# Patient Record
Sex: Male | Born: 1955 | Race: White | Hispanic: No | Smoking: Never smoker
Health system: Southern US, Community
[De-identification: ages and names within clinical notes are randomized; demographics above are authoritative.]

## PROBLEM LIST (undated history)

## (undated) DIAGNOSIS — C61 Malignant neoplasm of prostate: Secondary | ICD-10-CM

## (undated) DIAGNOSIS — I10 Essential (primary) hypertension: Secondary | ICD-10-CM

## (undated) DIAGNOSIS — G4733 Obstructive sleep apnea (adult) (pediatric): Secondary | ICD-10-CM

## (undated) DIAGNOSIS — G4730 Sleep apnea, unspecified: Secondary | ICD-10-CM

## (undated) DIAGNOSIS — M1990 Unspecified osteoarthritis, unspecified site: Secondary | ICD-10-CM

## (undated) DIAGNOSIS — E538 Deficiency of other specified B group vitamins: Secondary | ICD-10-CM

## (undated) DIAGNOSIS — E7800 Pure hypercholesterolemia, unspecified: Secondary | ICD-10-CM

## (undated) DIAGNOSIS — K760 Fatty (change of) liver, not elsewhere classified: Secondary | ICD-10-CM

## (undated) DIAGNOSIS — R7303 Prediabetes: Secondary | ICD-10-CM

## (undated) DIAGNOSIS — F1090 Alcohol use, unspecified, uncomplicated: Secondary | ICD-10-CM

## (undated) DIAGNOSIS — Z87820 Personal history of traumatic brain injury: Secondary | ICD-10-CM

## (undated) HISTORY — PX: PROSTATE BIOPSY: SHX241

## (undated) HISTORY — DX: Deficiency of other specified B group vitamins: E53.8

## (undated) HISTORY — DX: Obstructive sleep apnea (adult) (pediatric): G47.33

## (undated) HISTORY — DX: Alcohol use, unspecified, uncomplicated: F10.90

## (undated) HISTORY — DX: Prediabetes: R73.03

## (undated) HISTORY — DX: Personal history of traumatic brain injury: Z87.820

## (undated) HISTORY — DX: Fatty (change of) liver, not elsewhere classified: K76.0

---

## 1974-01-11 HISTORY — PX: KNEE ARTHROTOMY: SHX993

## 2007-01-12 HISTORY — PX: ACHILLES TENDON REPAIR: SUR1153

## 2009-08-22 ENCOUNTER — Encounter: Admission: RE | Admit: 2009-08-22 | Discharge: 2009-08-22 | Payer: Self-pay | Admitting: Orthopaedic Surgery

## 2009-12-11 ENCOUNTER — Encounter: Admission: RE | Admit: 2009-12-11 | Discharge: 2009-12-11 | Payer: Self-pay | Admitting: Orthopaedic Surgery

## 2010-01-11 HISTORY — PX: PROSTATECTOMY: SHX69

## 2010-08-12 ENCOUNTER — Other Ambulatory Visit: Payer: Self-pay | Admitting: Urology

## 2010-08-12 ENCOUNTER — Other Ambulatory Visit (HOSPITAL_COMMUNITY): Payer: Self-pay | Admitting: Urology

## 2010-08-12 ENCOUNTER — Encounter (HOSPITAL_COMMUNITY): Payer: BC Managed Care – PPO

## 2010-08-12 ENCOUNTER — Ambulatory Visit (HOSPITAL_COMMUNITY)
Admission: RE | Admit: 2010-08-12 | Discharge: 2010-08-12 | Disposition: A | Discharge status: Home / Self Care | Payer: BC Managed Care – PPO | Source: Ambulatory Visit | Attending: Urology | Admitting: Urology

## 2010-08-12 DIAGNOSIS — Z01812 Encounter for preprocedural laboratory examination: Secondary | ICD-10-CM | POA: Insufficient documentation

## 2010-08-12 DIAGNOSIS — Z01810 Encounter for preprocedural cardiovascular examination: Secondary | ICD-10-CM | POA: Insufficient documentation

## 2010-08-12 DIAGNOSIS — I10 Essential (primary) hypertension: Secondary | ICD-10-CM | POA: Insufficient documentation

## 2010-08-12 DIAGNOSIS — C61 Malignant neoplasm of prostate: Secondary | ICD-10-CM

## 2010-08-12 DIAGNOSIS — Z01818 Encounter for other preprocedural examination: Secondary | ICD-10-CM | POA: Insufficient documentation

## 2010-08-12 DIAGNOSIS — R9431 Abnormal electrocardiogram [ECG] [EKG]: Secondary | ICD-10-CM | POA: Insufficient documentation

## 2010-08-12 DIAGNOSIS — G4730 Sleep apnea, unspecified: Secondary | ICD-10-CM | POA: Insufficient documentation

## 2010-08-12 LAB — CBC
HCT: 41.8 % (ref 39.0–52.0)
Hemoglobin: 14.8 g/dL (ref 13.0–17.0)
MCV: 91.3 fL (ref 78.0–100.0)
RBC: 4.58 MIL/uL (ref 4.22–5.81)
RDW: 12.8 % (ref 11.5–15.5)
WBC: 4.7 10*3/uL (ref 4.0–10.5)

## 2010-08-12 LAB — BASIC METABOLIC PANEL
BUN: 15 mg/dL (ref 6–23)
CO2: 24 mEq/L (ref 19–32)
Chloride: 102 mEq/L (ref 96–112)
Creatinine, Ser: 0.8 mg/dL (ref 0.50–1.35)
GFR calc Af Amer: >60 mL/min (ref >60)
Glucose, Bld: 108 mg/dL — ABNORMAL HIGH (ref 70–99)
Potassium: 3.7 mEq/L (ref 3.5–5.1)

## 2010-08-17 ENCOUNTER — Other Ambulatory Visit: Payer: Self-pay | Admitting: Urology

## 2010-08-17 ENCOUNTER — Inpatient Hospital Stay (HOSPITAL_COMMUNITY)
Admission: RE | Admit: 2010-08-17 | Discharge: 2010-08-18 | DRG: 334 | Disposition: A | Discharge status: Home / Self Care | Payer: BC Managed Care – PPO | Source: Ambulatory Visit | Attending: Urology | Admitting: Urology

## 2010-08-17 DIAGNOSIS — G4733 Obstructive sleep apnea (adult) (pediatric): Secondary | ICD-10-CM | POA: Diagnosis present

## 2010-08-17 DIAGNOSIS — E538 Deficiency of other specified B group vitamins: Secondary | ICD-10-CM | POA: Diagnosis present

## 2010-08-17 DIAGNOSIS — I10 Essential (primary) hypertension: Secondary | ICD-10-CM | POA: Diagnosis present

## 2010-08-17 DIAGNOSIS — C61 Malignant neoplasm of prostate: Principal | ICD-10-CM | POA: Diagnosis present

## 2010-08-17 DIAGNOSIS — E559 Vitamin D deficiency, unspecified: Secondary | ICD-10-CM | POA: Diagnosis present

## 2010-08-17 DIAGNOSIS — Z01812 Encounter for preprocedural laboratory examination: Secondary | ICD-10-CM

## 2010-08-17 DIAGNOSIS — E7800 Pure hypercholesterolemia, unspecified: Secondary | ICD-10-CM | POA: Diagnosis present

## 2010-08-17 LAB — HEMOGLOBIN AND HEMATOCRIT, BLOOD: Hemoglobin: 14.2 g/dL (ref 13.0–17.0)

## 2010-08-17 LAB — TYPE AND SCREEN
ABO/RH(D): O NEG
Antibody Screen: NEGATIVE

## 2010-08-31 NOTE — Op Note (Signed)
[name], [name] NO.:  [id number]  MEDICAL RECORD NO.:  [id number]  LOCATION:  1438                         FACILITY:  [facility]  PHYSICIAN:  Heloise Purpura, MD      DATE OF BIRTH:  09-15-1955  DATE OF PROCEDURE:  08/17/2010 DATE OF DISCHARGE:                              OPERATIVE REPORT   PREOPERATIVE DIAGNOSIS:  Clinically localized adenocarcinoma of the prostate (clinical stage T1c, NX, MX).  POSTOPERATIVE DIAGNOSIS:  Clinically localized adenocarcinoma of the prostate (clinical stage T1c, NX, MX).  PROCEDURES.: 1. Robotic-assisted laparoscopic radical prostatectomy (bilateral     nerves). 2. Bilateral robotic-assisted laparoscopic pelvic lymphadenectomy.  SURGEON:  Heloise Purpura, MD  ASSISTANT:  Delia Chimes, NP  ANESTHESIA:  General.  COMPLICATIONS:  None.  ESTIMATED BLOOD LOSS:  350 mL.  INTRAVENOUS FLUIDS:  2 liters of crystalloid.  SPECIMENS: 1. Prostate seminal vesicles. 2. Right pelvic lymph nodes. 3. Left pelvic lymph nodes.  DISPOSITION:  Specimens to pathology.  DRAINS: 1. 20-French coude cath. 2. #19 Blake pelvic drain.  INDICATIONS:  [name] is a 55 year old gentleman with clinically localized adenocarcinoma of prostate.  After discussing management options for treatment, he elected to proceed with surgical therapy and the above procedures.  The potential risks, complications, and alternative treatment options were discussed in detail, and informed consent obtained.  DESCRIPTION OF PROCEDURE:  The patient was taken to the operating room and a general anesthetic was administered.  He was given preoperative antibiotics, placed in the dorsal lithotomy position, and prepped and draped in the usual sterile fashion.  Next preoperative time-out was performed.  A Foley catheter was then inserted into the bladder and a site was selected just superior to the umbilicus for placement of the camera port.  This was placed using  standard open Hasson technique which allowed entry into the peritoneal cavity under direct vision without difficulty.  A 12 mm port was placed and the pneumoperitoneum established.  With a 0 degrees lens, the abdomen was inspected and there was no evidence for any intra-abdominal injuries or other abnormalities. The remaining ports were then placed.  An 8 mm robotic ports were placed in the right lower quadrant, left lower quadrant, and far left lower quadrant.  A 5 mm port was in the right upper quadrant and a 12 mm port was placed in the far right lateral abdominal wall for laparoscopic assistance.  All ports were placed under direct vision without difficulty.  The surgical cart was then docked.  With the aid of the cautery scissors, the bladder was reflected posteriorly allowing entry into the space of Retzius and identification of the endopelvic fascia and prostate.  The endopelvic fascia was incised from the apex back to the base of the prostate bilaterally.  The underlying levator muscle fibers were swept laterally off the prostate thereby isolating the dorsal venous complex.  The dorsal vein was then stapled and divided with a 45 mm flex Echelon stapler.  The bladder neck was identified with the aid of Foley catheter manipulation and was divided anteriorly.  This exposed the catheter and the catheter balloon was deflated.  The catheter was brought into the operative field and used  to retract the prostate anteriorly.  This exposed the posterior bladder neck which did reveal a small median lobe.  This was able to be excised with the prostate and dissection proceeded between the prostate and bladder until the vasa deferentia and seminal vesicles were identified.  The vasa deferentia were isolated, divided and lifted anteriorly.  The seminal vesicles were then dissected down to their tips with care to control the seminal vesicle arterial blood supply.  The space between the  anterior rectum and Denonvilliers' fascia was then developed with a combination of sharp and blunt dissection.  The space was somewhat obscured and did require some sharp dissection prior to entering the correct plane.  The lateral prostatic fascia was then incised sharply bilaterally allowing the neurovascular bundles to be preserved.  On the right side, the vascular pedicle, the prostate was ligated with Hem-o-lok clips and divided with sharp cold scissor dissection.  On the left side, the vascular pedicle was also ligated with Hem-o-lok clips and divided with sharp cold scissor dissection.  Due to the patient's extent of disease on the left side, a few millimeters of periprosthetic fat was left on the prostatic capsule in essence performing a partial nerve sparing procedure on left side.  The neurovascular bundles were then swept off the apex of the prostate and urethra.  The urethra was sharply transected allowing the prostate specimen to be disarticulated.  The pelvis was then copiously irrigated.  Hemostasis was assured.  There was no evidence for a rectal history.  Attention then turned to the right pelvic sidewall.  The fibrofatty tissue between the external iliac vein, confluence of the iliac vessels, hypogastric artery, and Cooper's ligament was dissected free from the pelvic sidewall with care to preserve the obturator nerve.  Hem-o-lok clips used for lymphostasis and hemostasis.  An identical procedure was performed on the contralateral side and both lymphatic packets were subsequently removed for permanent pathologic analysis.  Attention turned to the urethral anastomosis. However, there was noted to be some bleeding from the left neurovascular bundle which was controlled with multiple figure-of-eight sutures of 2-0 Vicryl and 3-0 Monocryl.  This resulted in excellent hemostasis and the urethral anastomosis was then performed with a 2-0 Vicryl slip-knot used to reapproximate  the bladder neck and urethra as well as the Denonvilliers' fascia.  A double-armed 3-0 Monocryl suture was then used to perform a 360 degree running tension-free anastomosis between these structures.  A 20-French coude catheter was inserted into the bladder and irrigated.  There were no blood clots within the bladder and the anastomosis appeared to be watertight.  A #19 Blake drain was then brought through the left robotic port, positioned appropriately into the pelvis.  It was secured to skin with a nylon suture.  The surgical cart was then undocked.  The right lateral 12-mm port site was closed with a 0 Vicryl suture placed laparoscopically.  All remaining ports removed under direct vision and the prostate was removed intact within the Endopouch retrieval bag via the periumbilical port site.  This fascial opening was then closed with two running 0 Vicryl sutures.  All port sites were injected with 0.25% Marcaine and reapproximated at skin level with staples.  Sterile dressings were applied.  The patient appeared to tolerate the procedure well without complications.  He was able to be extubated and transferred to the recovery unit in satisfactory condition.     Heloise Purpura, MD     LB/MEDQ  D:  08/17/2010  T:  08/18/2010  Job:  [id number]  Electronically Signed by Heloise Purpura MD on 08/31/2010 06:45:19 PM

## 2010-11-23 ENCOUNTER — Emergency Department (HOSPITAL_COMMUNITY)
Admission: EM | Admit: 2010-11-23 | Discharge: 2010-11-24 | Discharge status: Against Medical Advice | Payer: BC Managed Care – PPO | Attending: Emergency Medicine | Admitting: Emergency Medicine

## 2010-11-23 ENCOUNTER — Encounter: Payer: Self-pay | Admitting: *Deleted

## 2010-11-23 DIAGNOSIS — R509 Fever, unspecified: Secondary | ICD-10-CM | POA: Insufficient documentation

## 2010-11-23 HISTORY — DX: Malignant neoplasm of prostate: C61

## 2010-11-23 MED ORDER — ACETAMINOPHEN 325 MG PO TABS
650.0000 mg | ORAL_TABLET | Freq: Once | ORAL | Status: AC
  Administered 2010-11-23: 650 mg via ORAL

## 2010-11-23 MED ORDER — ACETAMINOPHEN 325 MG PO TABS
ORAL_TABLET | ORAL | Status: AC
  Administered 2010-11-23: 650 mg via ORAL
  Filled 2010-11-23: qty 2

## 2010-11-23 NOTE — ED Notes (Signed)
Pt reports fever at home, highest 104.6. Chills, joint soreness. denies cough, n/v.

## 2011-08-04 ENCOUNTER — Other Ambulatory Visit (HOSPITAL_COMMUNITY): Payer: Self-pay | Admitting: Orthopaedic Surgery

## 2011-08-05 ENCOUNTER — Other Ambulatory Visit (HOSPITAL_COMMUNITY): Payer: Self-pay | Admitting: Orthopaedic Surgery

## 2011-08-06 ENCOUNTER — Encounter (HOSPITAL_COMMUNITY): Payer: Self-pay | Admitting: Pharmacy Technician

## 2011-08-10 ENCOUNTER — Ambulatory Visit (HOSPITAL_COMMUNITY)
Admission: RE | Admit: 2011-08-10 | Discharge: 2011-08-10 | Disposition: A | Discharge status: Home / Self Care | Payer: BC Managed Care – PPO | Source: Ambulatory Visit | Attending: Orthopaedic Surgery | Admitting: Orthopaedic Surgery

## 2011-08-10 ENCOUNTER — Encounter (HOSPITAL_COMMUNITY): Payer: Self-pay

## 2011-08-10 ENCOUNTER — Encounter (HOSPITAL_COMMUNITY)
Admission: RE | Admit: 2011-08-10 | Discharge: 2011-08-10 | Disposition: A | Discharge status: Home / Self Care | Payer: BC Managed Care – PPO | Source: Ambulatory Visit | Attending: Orthopaedic Surgery | Admitting: Orthopaedic Surgery

## 2011-08-10 DIAGNOSIS — Z01810 Encounter for preprocedural cardiovascular examination: Secondary | ICD-10-CM | POA: Insufficient documentation

## 2011-08-10 DIAGNOSIS — Z01812 Encounter for preprocedural laboratory examination: Secondary | ICD-10-CM | POA: Insufficient documentation

## 2011-08-10 DIAGNOSIS — Z01818 Encounter for other preprocedural examination: Secondary | ICD-10-CM | POA: Insufficient documentation

## 2011-08-10 HISTORY — DX: Pure hypercholesterolemia, unspecified: E78.00

## 2011-08-10 HISTORY — DX: Unspecified osteoarthritis, unspecified site: M19.90

## 2011-08-10 HISTORY — DX: Sleep apnea, unspecified: G47.30

## 2011-08-10 HISTORY — DX: Essential (primary) hypertension: I10

## 2011-08-10 LAB — URINALYSIS, ROUTINE W REFLEX MICROSCOPIC
Glucose, UA: NEGATIVE mg/dL
Hgb urine dipstick: NEGATIVE
Ketones, ur: NEGATIVE mg/dL
Protein, ur: NEGATIVE mg/dL
pH: 6 (ref 5.0–8.0)

## 2011-08-10 LAB — BASIC METABOLIC PANEL
BUN: 12 mg/dL (ref 6–23)
CO2: 25 mEq/L (ref 19–32)
Chloride: 100 mEq/L (ref 96–112)
Glucose, Bld: 112 mg/dL — ABNORMAL HIGH (ref 70–99)
Potassium: 4 mEq/L (ref 3.5–5.1)

## 2011-08-10 LAB — CBC
HCT: 42.5 % (ref 39.0–52.0)
Hemoglobin: 15.1 g/dL (ref 13.0–17.0)
MCH: 31.9 pg (ref 26.0–34.0)
MCHC: 35.5 g/dL (ref 30.0–36.0)
RBC: 4.73 MIL/uL (ref 4.22–5.81)

## 2011-08-10 LAB — SURGICAL PCR SCREEN
MRSA, PCR: NEGATIVE
Staphylococcus aureus: NEGATIVE

## 2011-08-10 LAB — PROTIME-INR: Prothrombin Time: 12.9 seconds (ref 11.6–15.2)

## 2011-08-10 NOTE — Patient Instructions (Signed)
20 [name]  08/10/2011   Your procedure is scheduled on:  08/20/11 AT 12:30 PM  Report to SHORT STAY DEPT  at 10:00 AM.  Call this number if you have problems the morning of surgery: [phone]   Remember:   Do not eat food  AFTER MIDNIGHT  May have clear liquids UNTIL 6 HOURS BEFORE SURGERY (6:30 AM)  Clear liquids include soda, tea, black coffee, apple or grape juice, broth.  Take these medicines the morning of surgery with A SIP OF WATER: VICODIN IF NEEDED   Do not wear jewelry, make-up or nail polish.  Do not wear lotions, powders, or perfumes.   Do not shave legs or underarms 48 hrs. before surgery (men may shave face)  Do not bring valuables to the hospital.  Contacts, dentures or bridgework may not be worn into surgery.  Leave suitcase in the car. After surgery it may be brought to your room.  For patients admitted to the hospital, checkout time is 11:00 AM the day of discharge.   Patients discharged the day of surgery will not be allowed to drive home.    Special Instructions:   Please read over the following fact sheets that you were given: MRSA  Information               SHOWER WITH BETASEPT THE NIGHT BEFORE SURGERY AND THE MORNING OF SURGERY               STOP ASPIRIN AND SUPPLEMENT 5-7 DAYS PREOP              BRING C-PAP MASK / TUBING TO HOSPITAL

## 2011-08-19 ENCOUNTER — Other Ambulatory Visit (HOSPITAL_COMMUNITY): Payer: Self-pay | Admitting: Orthopaedic Surgery

## 2011-08-20 ENCOUNTER — Ambulatory Visit (HOSPITAL_COMMUNITY): Payer: BC Managed Care – PPO

## 2011-08-20 ENCOUNTER — Encounter (HOSPITAL_COMMUNITY): Payer: Self-pay | Admitting: Anesthesiology

## 2011-08-20 ENCOUNTER — Inpatient Hospital Stay (HOSPITAL_COMMUNITY): Payer: BC Managed Care – PPO

## 2011-08-20 ENCOUNTER — Encounter (HOSPITAL_COMMUNITY)
Admission: RE | Disposition: A | Discharge status: Home Health | Payer: Self-pay | Source: Ambulatory Visit | Attending: Orthopaedic Surgery

## 2011-08-20 ENCOUNTER — Ambulatory Visit (HOSPITAL_COMMUNITY): Payer: BC Managed Care – PPO | Admitting: Anesthesiology

## 2011-08-20 ENCOUNTER — Inpatient Hospital Stay (HOSPITAL_COMMUNITY)
Admission: RE | Admit: 2011-08-20 | Discharge: 2011-08-23 | DRG: 818 | Disposition: A | Discharge status: Home Health | Payer: BC Managed Care – PPO | Source: Ambulatory Visit | Attending: Orthopaedic Surgery | Admitting: Orthopaedic Surgery

## 2011-08-20 ENCOUNTER — Encounter (HOSPITAL_COMMUNITY): Payer: Self-pay | Admitting: *Deleted

## 2011-08-20 DIAGNOSIS — G4730 Sleep apnea, unspecified: Secondary | ICD-10-CM | POA: Diagnosis present

## 2011-08-20 DIAGNOSIS — M1610 Unilateral primary osteoarthritis, unspecified hip: Principal | ICD-10-CM | POA: Diagnosis present

## 2011-08-20 DIAGNOSIS — E7800 Pure hypercholesterolemia, unspecified: Secondary | ICD-10-CM | POA: Diagnosis present

## 2011-08-20 DIAGNOSIS — I10 Essential (primary) hypertension: Secondary | ICD-10-CM | POA: Diagnosis present

## 2011-08-20 DIAGNOSIS — M169 Osteoarthritis of hip, unspecified: Secondary | ICD-10-CM

## 2011-08-20 HISTORY — PX: STERIOD INJECTION: SHX5046

## 2011-08-20 HISTORY — PX: TOTAL HIP ARTHROPLASTY: SHX124

## 2011-08-20 LAB — TYPE AND SCREEN: ABO/RH(D): O NEG

## 2011-08-20 SURGERY — ARTHROPLASTY, HIP, TOTAL, ANTERIOR APPROACH
Anesthesia: Spinal | Site: Hip | Laterality: Right | Wound class: Clean

## 2011-08-20 MED ORDER — FERROUS SULFATE 325 (65 FE) MG PO TABS
325.0000 mg | ORAL_TABLET | Freq: Three times a day (TID) | ORAL | Status: DC
  Administered 2011-08-20 – 2011-08-22 (×6): 325 mg via ORAL
  Filled 2011-08-20 (×11): qty 1

## 2011-08-20 MED ORDER — VALSARTAN-HYDROCHLOROTHIAZIDE 160-25 MG PO TABS: 1.0000 | ORAL_TABLET | Freq: Every day | ORAL | Status: DC

## 2011-08-20 MED ORDER — MEPERIDINE HCL 50 MG/ML IJ SOLN: 6.2500 mg | INTRAMUSCULAR | Status: DC | PRN

## 2011-08-20 MED ORDER — SODIUM CHLORIDE 0.9 % IJ SOLN: 9.0000 mL | INTRAMUSCULAR | Status: DC | PRN

## 2011-08-20 MED ORDER — BUPIVACAINE HCL (PF) 0.25 % IJ SOLN
INTRAMUSCULAR | Status: AC
  Filled 2011-08-20: qty 30

## 2011-08-20 MED ORDER — ASPIRIN EC 325 MG PO TBEC
325.0000 mg | DELAYED_RELEASE_TABLET | Freq: Two times a day (BID) | ORAL | Status: DC
  Administered 2011-08-21 – 2011-08-23 (×5): 325 mg via ORAL
  Filled 2011-08-20 (×7): qty 1

## 2011-08-20 MED ORDER — VITAMIN D3 25 MCG (1000 UNIT) PO TABS
2000.0000 [IU] | ORAL_TABLET | Freq: Every day | ORAL | Status: DC
  Administered 2011-08-20 – 2011-08-23 (×4): 2000 [IU] via ORAL
  Filled 2011-08-20 (×4): qty 2

## 2011-08-20 MED ORDER — SODIUM CHLORIDE 0.9 % IV SOLN
INTRAVENOUS | Status: DC
  Administered 2011-08-20 – 2011-08-21 (×2): via INTRAVENOUS

## 2011-08-20 MED ORDER — HYDROMORPHONE 0.3 MG/ML IV SOLN
INTRAVENOUS | Status: DC
Start: 2011-08-20 — End: 2011-08-22
  Administered 2011-08-20: 5.6 mg via INTRAVENOUS
  Administered 2011-08-20: 21:00:00 via INTRAVENOUS
  Administered 2011-08-21: 1.8 mg via INTRAVENOUS
  Administered 2011-08-21: 08:00:00 via INTRAVENOUS
  Administered 2011-08-21: 1.6 mg via INTRAVENOUS
  Administered 2011-08-21: 2.04 mg via INTRAVENOUS
  Administered 2011-08-21: 1.2 mg via INTRAVENOUS
  Administered 2011-08-21: 3 mg via INTRAVENOUS
  Administered 2011-08-21: 20:00:00 via INTRAVENOUS
  Administered 2011-08-22: 0.3 mg via INTRAVENOUS
  Administered 2011-08-22: 1.5 mg via INTRAVENOUS
  Administered 2011-08-22: 2.01 mg via INTRAVENOUS
  Filled 2011-08-20 (×2): qty 25

## 2011-08-20 MED ORDER — LACTATED RINGERS IV SOLN: INTRAVENOUS | Status: DC

## 2011-08-20 MED ORDER — DOCUSATE SODIUM 100 MG PO CAPS
100.0000 mg | ORAL_CAPSULE | Freq: Two times a day (BID) | ORAL | Status: DC
  Administered 2011-08-20 – 2011-08-23 (×5): 100 mg via ORAL

## 2011-08-20 MED ORDER — METHOCARBAMOL 500 MG PO TABS
500.0000 mg | ORAL_TABLET | Freq: Four times a day (QID) | ORAL | Status: DC | PRN
  Administered 2011-08-21 – 2011-08-23 (×2): 500 mg via ORAL
  Filled 2011-08-20 (×2): qty 1

## 2011-08-20 MED ORDER — MIDAZOLAM HCL 5 MG/5ML IJ SOLN
INTRAMUSCULAR | Status: DC | PRN
  Administered 2011-08-20 (×2): 1 mg via INTRAVENOUS
  Administered 2011-08-20: 2 mg via INTRAVENOUS

## 2011-08-20 MED ORDER — LACTATED RINGERS IV SOLN
INTRAVENOUS | Status: DC | PRN
  Administered 2011-08-20 (×3): via INTRAVENOUS

## 2011-08-20 MED ORDER — ALUM & MAG HYDROXIDE-SIMETH 200-200-20 MG/5ML PO SUSP
30.0000 mL | ORAL | Status: DC | PRN
  Administered 2011-08-22 (×2): 30 mL via ORAL
  Filled 2011-08-20 (×2): qty 30

## 2011-08-20 MED ORDER — FENTANYL CITRATE 0.05 MG/ML IJ SOLN
INTRAMUSCULAR | Status: DC | PRN
  Administered 2011-08-20 (×2): 50 ug via INTRAVENOUS

## 2011-08-20 MED ORDER — IRBESARTAN 150 MG PO TABS
150.0000 mg | ORAL_TABLET | Freq: Every day | ORAL | Status: DC
  Administered 2011-08-21 – 2011-08-23 (×3): 150 mg via ORAL
  Filled 2011-08-20 (×3): qty 1

## 2011-08-20 MED ORDER — OXYCODONE HCL 5 MG PO TABS
5.0000 mg | ORAL_TABLET | ORAL | Status: DC | PRN
  Administered 2011-08-20: 10 mg via ORAL
  Administered 2011-08-20: 5 mg via ORAL
  Administered 2011-08-21 – 2011-08-22 (×3): 10 mg via ORAL
  Administered 2011-08-23 (×2): 5 mg via ORAL
  Filled 2011-08-20 (×2): qty 1
  Filled 2011-08-20: qty 2
  Filled 2011-08-20: qty 1
  Filled 2011-08-20 (×3): qty 2

## 2011-08-20 MED ORDER — ACETAMINOPHEN 650 MG RE SUPP: 650.0000 mg | Freq: Four times a day (QID) | RECTAL | Status: DC | PRN

## 2011-08-20 MED ORDER — METHYLPREDNISOLONE ACETATE 40 MG/ML IJ SUSP
INTRAMUSCULAR | Status: DC | PRN
  Administered 2011-08-20: 12:00:00 via INTRA_ARTICULAR

## 2011-08-20 MED ORDER — EPHEDRINE SULFATE 50 MG/ML IJ SOLN
INTRAMUSCULAR | Status: DC | PRN
  Administered 2011-08-20 (×2): 10 mg via INTRAVENOUS

## 2011-08-20 MED ORDER — CEFAZOLIN SODIUM 1-5 GM-% IV SOLN
INTRAVENOUS | Status: DC | PRN
  Administered 2011-08-20: 2 g via INTRAVENOUS

## 2011-08-20 MED ORDER — HETASTARCH-ELECTROLYTES 6 % IV SOLN
INTRAVENOUS | Status: DC | PRN
  Administered 2011-08-20: 14:00:00 via INTRAVENOUS

## 2011-08-20 MED ORDER — KETOROLAC TROMETHAMINE 15 MG/ML IJ SOLN
7.5000 mg | Freq: Four times a day (QID) | INTRAMUSCULAR | Status: AC
  Administered 2011-08-20 – 2011-08-21 (×3): 7.5 mg via INTRAVENOUS
  Filled 2011-08-20 (×4): qty 1

## 2011-08-20 MED ORDER — DEXTROSE 5 % IV SOLN
3.0000 g | INTRAVENOUS | Status: DC
  Filled 2011-08-20: qty 3000

## 2011-08-20 MED ORDER — ZOLPIDEM TARTRATE 5 MG PO TABS: 5.0000 mg | ORAL_TABLET | Freq: Every evening | ORAL | Status: DC | PRN

## 2011-08-20 MED ORDER — METHYLPREDNISOLONE ACETATE 40 MG/ML IJ SUSP
INTRAMUSCULAR | Status: AC
  Filled 2011-08-20: qty 1

## 2011-08-20 MED ORDER — THERA M PLUS PO TABS: 1.0000 | ORAL_TABLET | Freq: Every day | ORAL | Status: DC

## 2011-08-20 MED ORDER — PROMETHAZINE HCL 25 MG/ML IJ SOLN: 6.2500 mg | INTRAMUSCULAR | Status: DC | PRN

## 2011-08-20 MED ORDER — CEFAZOLIN SODIUM 1-5 GM-% IV SOLN
INTRAVENOUS | Status: AC
  Filled 2011-08-20: qty 50

## 2011-08-20 MED ORDER — 0.9 % SODIUM CHLORIDE (POUR BTL) OPTIME
TOPICAL | Status: DC | PRN
  Administered 2011-08-20: 1000 mL

## 2011-08-20 MED ORDER — PHENOL 1.4 % MT LIQD: 1.0000 | OROMUCOSAL | Status: DC | PRN

## 2011-08-20 MED ORDER — DIPHENHYDRAMINE HCL 50 MG/ML IJ SOLN: 12.5000 mg | Freq: Four times a day (QID) | INTRAMUSCULAR | Status: DC | PRN

## 2011-08-20 MED ORDER — DIPHENHYDRAMINE HCL 12.5 MG/5ML PO ELIX: 12.5000 mg | ORAL_SOLUTION | ORAL | Status: DC | PRN

## 2011-08-20 MED ORDER — MENTHOL 3 MG MT LOZG: 1.0000 | LOZENGE | OROMUCOSAL | Status: DC | PRN

## 2011-08-20 MED ORDER — ONDANSETRON HCL 4 MG PO TABS: 4.0000 mg | ORAL_TABLET | Freq: Four times a day (QID) | ORAL | Status: DC | PRN

## 2011-08-20 MED ORDER — BUPIVACAINE HCL (PF) 0.5 % IJ SOLN
INTRAMUSCULAR | Status: AC
  Filled 2011-08-20: qty 30

## 2011-08-20 MED ORDER — METHOCARBAMOL 100 MG/ML IJ SOLN
500.0000 mg | Freq: Four times a day (QID) | INTRAVENOUS | Status: DC | PRN
  Administered 2011-08-20: 500 mg via INTRAVENOUS
  Filled 2011-08-20 (×2): qty 5

## 2011-08-20 MED ORDER — ACETAMINOPHEN 325 MG PO TABS: 650.0000 mg | ORAL_TABLET | Freq: Four times a day (QID) | ORAL | Status: DC | PRN

## 2011-08-20 MED ORDER — MORPHINE SULFATE 2 MG/ML IJ SOLN
2.0000 mg | INTRAMUSCULAR | Status: DC | PRN
  Administered 2011-08-20: 2 mg via INTRAVENOUS
  Filled 2011-08-20 (×2): qty 1

## 2011-08-20 MED ORDER — CEFAZOLIN SODIUM-DEXTROSE 2-3 GM-% IV SOLR
INTRAVENOUS | Status: AC
  Filled 2011-08-20: qty 50

## 2011-08-20 MED ORDER — METOCLOPRAMIDE HCL 10 MG PO TABS: 5.0000 mg | ORAL_TABLET | Freq: Three times a day (TID) | ORAL | Status: DC | PRN

## 2011-08-20 MED ORDER — PROPOFOL 10 MG/ML IV EMUL
INTRAVENOUS | Status: DC | PRN
  Administered 2011-08-20: 80 ug/kg/min via INTRAVENOUS

## 2011-08-20 MED ORDER — HYDROMORPHONE 0.3 MG/ML IV SOLN
INTRAVENOUS | Status: AC
  Filled 2011-08-20: qty 25

## 2011-08-20 MED ORDER — ADULT MULTIVITAMIN W/MINERALS CH
1.0000 | ORAL_TABLET | Freq: Every day | ORAL | Status: DC
  Administered 2011-08-20 – 2011-08-23 (×4): 1 via ORAL
  Filled 2011-08-20 (×4): qty 1

## 2011-08-20 MED ORDER — METOCLOPRAMIDE HCL 5 MG/ML IJ SOLN: 5.0000 mg | Freq: Three times a day (TID) | INTRAMUSCULAR | Status: DC | PRN

## 2011-08-20 MED ORDER — NALOXONE HCL 0.4 MG/ML IJ SOLN: 0.4000 mg | INTRAMUSCULAR | Status: DC | PRN

## 2011-08-20 MED ORDER — ACETAMINOPHEN 10 MG/ML IV SOLN
INTRAVENOUS | Status: AC
  Filled 2011-08-20: qty 100

## 2011-08-20 MED ORDER — HYDROCHLOROTHIAZIDE 25 MG PO TABS
25.0000 mg | ORAL_TABLET | Freq: Every day | ORAL | Status: DC
  Administered 2011-08-21 – 2011-08-23 (×3): 25 mg via ORAL
  Filled 2011-08-20 (×3): qty 1

## 2011-08-20 MED ORDER — VITAMIN D 50 MCG (2000 UT) PO CAPS: 1.0000 | ORAL_CAPSULE | Freq: Every day | ORAL | Status: DC

## 2011-08-20 MED ORDER — HYDROMORPHONE HCL PF 1 MG/ML IJ SOLN: 0.2500 mg | INTRAMUSCULAR | Status: DC | PRN

## 2011-08-20 MED ORDER — ONDANSETRON HCL 4 MG/2ML IJ SOLN: 4.0000 mg | Freq: Four times a day (QID) | INTRAMUSCULAR | Status: DC | PRN

## 2011-08-20 MED ORDER — CEFAZOLIN SODIUM 1-5 GM-% IV SOLN
1.0000 g | Freq: Four times a day (QID) | INTRAVENOUS | Status: AC
  Administered 2011-08-20 (×2): 1 g via INTRAVENOUS
  Filled 2011-08-20 (×2): qty 50

## 2011-08-20 MED ORDER — BUPIVACAINE HCL (PF) 0.5 % IJ SOLN
INTRAMUSCULAR | Status: DC | PRN
  Administered 2011-08-20: 3 mL

## 2011-08-20 MED ORDER — ONDANSETRON HCL 4 MG/2ML IJ SOLN
4.0000 mg | Freq: Four times a day (QID) | INTRAMUSCULAR | Status: DC | PRN
Start: 2011-08-20 — End: 2011-08-22

## 2011-08-20 MED ORDER — DIPHENHYDRAMINE HCL 12.5 MG/5ML PO ELIX: 12.5000 mg | ORAL_SOLUTION | Freq: Four times a day (QID) | ORAL | Status: DC | PRN

## 2011-08-20 SURGICAL SUPPLY — 37 items
BAG ZIPLOCK 12X15 (MISCELLANEOUS) ×6 IMPLANT
BLADE SAW SGTL 18X1.27X75 (BLADE) ×3 IMPLANT
CELLS DAT CNTRL 66122 CELL SVR (MISCELLANEOUS) ×2 IMPLANT
CLOTH BEACON ORANGE TIMEOUT ST (SAFETY) ×3 IMPLANT
DRAPE C-ARM 42X72 X-RAY (DRAPES) ×3 IMPLANT
DRAPE STERI IOBAN 125X83 (DRAPES) ×3 IMPLANT
DRAPE U-SHAPE 47X51 STRL (DRAPES) ×9 IMPLANT
DRSG MEPILEX BORDER 4X8 (GAUZE/BANDAGES/DRESSINGS) ×3 IMPLANT
DURAPREP 26ML APPLICATOR (WOUND CARE) ×3 IMPLANT
ELECT BLADE TIP CTD 4 INCH (ELECTRODE) ×3 IMPLANT
ELECT REM PT RETURN 9FT ADLT (ELECTROSURGICAL) ×3
ELECTRODE REM PT RTRN 9FT ADLT (ELECTROSURGICAL) ×2 IMPLANT
FACESHIELD LNG OPTICON STERILE (SAFETY) ×12 IMPLANT
GAUZE XEROFORM 1X8 LF (GAUZE/BANDAGES/DRESSINGS) ×3 IMPLANT
GLOVE BIO SURGEON STRL SZ7 (GLOVE) ×3 IMPLANT
GLOVE BIO SURGEON STRL SZ7.5 (GLOVE) ×3 IMPLANT
GLOVE BIOGEL PI IND STRL 7.5 (GLOVE) IMPLANT
GLOVE BIOGEL PI IND STRL 8 (GLOVE) ×2 IMPLANT
GLOVE BIOGEL PI INDICATOR 7.5 (GLOVE)
GLOVE BIOGEL PI INDICATOR 8 (GLOVE) ×1
GLOVE ECLIPSE 7.0 STRL STRAW (GLOVE) ×3 IMPLANT
GOWN STRL REIN XL XLG (GOWN DISPOSABLE) ×6 IMPLANT
KIT BASIN OR (CUSTOM PROCEDURE TRAY) ×3 IMPLANT
NEEDLE HYPO 22GX1.5 SAFETY (NEEDLE) ×6 IMPLANT
PACK TOTAL JOINT (CUSTOM PROCEDURE TRAY) ×3 IMPLANT
PADDING CAST COTTON 6X4 STRL (CAST SUPPLIES) ×3 IMPLANT
RTRCTR WOUND ALEXIS 18CM MED (MISCELLANEOUS) ×3
STAPLER VISISTAT 35W (STAPLE) ×3 IMPLANT
SUT ETHIBOND NAB CT1 #1 30IN (SUTURE) ×3 IMPLANT
SUT VIC AB 1 CT1 36 (SUTURE) IMPLANT
SUT VIC AB 2-0 CT1 27 (SUTURE) ×2
SUT VIC AB 2-0 CT1 TAPERPNT 27 (SUTURE) ×4 IMPLANT
SUT VLOC 180 0 24IN GS25 (SUTURE) ×3 IMPLANT
SYR 5ML LL (SYRINGE) ×3 IMPLANT
TOWEL OR 17X26 10 PK STRL BLUE (TOWEL DISPOSABLE) ×6 IMPLANT
TOWEL OR NON WOVEN STRL DISP B (DISPOSABLE) ×3 IMPLANT
TRAY FOLEY CATH 14FRSI W/METER (CATHETERS) ×3 IMPLANT

## 2011-08-20 NOTE — Progress Notes (Signed)
Utilization review completed.  

## 2011-08-20 NOTE — Anesthesia Preprocedure Evaluation (Addendum)
Anesthesia Evaluation  Patient identified by MRN, date of birth, ID band Patient awake    Reviewed: Allergy & Precautions, H&P , NPO status , Patient's Chart, lab work & pertinent test results  Airway Mallampati: II TM Distance: >3 FB Neck ROM: Full    Dental No notable dental hx.    Pulmonary neg pulmonary ROS, sleep apnea and Continuous Positive Airway Pressure Ventilation ,  breath sounds clear to auscultation  Pulmonary exam normal       Cardiovascular hypertension, Pt. on medications negative cardio ROS  Rhythm:Regular Rate:Normal     Neuro/Psych negative neurological ROS  negative psych ROS   GI/Hepatic negative GI ROS, Neg liver ROS,   Endo/Other  negative endocrine ROS  Renal/GU negative Renal ROS  negative genitourinary   Musculoskeletal negative musculoskeletal ROS (+)   Abdominal   Peds negative pediatric ROS (+)  Hematology negative hematology ROS (+)   Anesthesia Other Findings   Reproductive/Obstetrics negative OB ROS                           Anesthesia Physical Anesthesia Plan  ASA: II  Anesthesia Plan: Spinal   Post-op Pain Management:    Induction:   Airway Management Planned: Simple Face Mask  Additional Equipment:   Intra-op Plan:   Post-operative Plan:   Informed Consent: I have reviewed the patients History and Physical, chart, labs and discussed the procedure including the risks, benefits and alternatives for the proposed anesthesia with the patient or authorized representative who has indicated his/her understanding and acceptance.   Dental advisory given  Plan Discussed with: CRNA  Anesthesia Plan Comments:         Anesthesia Quick Evaluation

## 2011-08-20 NOTE — Anesthesia Procedure Notes (Signed)
Spinal  Patient location during procedure: OR Staffing Anesthesiologist: Kylena Mole Performed by: anesthesiologist  Preanesthetic Checklist Completed: patient identified, site marked, surgical consent, pre-op evaluation, timeout performed, IV checked, risks and benefits discussed and monitors and equipment checked Spinal Block Patient position: sitting Prep: Betadine Patient monitoring: heart rate, continuous pulse ox and blood pressure Approach: right paramedian Location: L2-3 Injection technique: single-shot Needle Needle type: Spinocan  Needle gauge: 22 G Needle length: 9 cm Additional Notes Expiration date of kit checked and confirmed. Patient tolerated procedure well, without complications.     

## 2011-08-20 NOTE — Transfer of Care (Signed)
Immediate Anesthesia Transfer of Care Note  Patient: [name]  Procedure(s) Performed: Procedure(s) (LRB): TOTAL HIP ARTHROPLASTY ANTERIOR APPROACH (Right) STEROID INJECTION (Left)  Patient Location: PACU  Anesthesia Type: Regional  Level of Consciousness: awake, alert , oriented and patient cooperative  Airway & Oxygen Therapy: Patient Spontanous Breathing and Patient connected to face mask oxygen  Post-op Assessment: Report given to PACU RN and Post -op Vital signs reviewed and stable  Post vital signs: Reviewed and stable  Complications: No apparent anesthesia complications

## 2011-08-20 NOTE — Anesthesia Postprocedure Evaluation (Signed)
  Anesthesia Post-op Note  Patient: [name]  Procedure(s) Performed: Procedure(s) (LRB): TOTAL HIP ARTHROPLASTY ANTERIOR APPROACH (Right) STEROID INJECTION (Left)  Patient Location: PACU  Anesthesia Type: General  Level of Consciousness: awake and alert   Airway and Oxygen Therapy: Patient Spontanous Breathing  Post-op Pain: mild  Post-op Assessment: Post-op Vital signs reviewed, Patient's Cardiovascular Status Stable, Respiratory Function Stable, Patent Airway and No signs of Nausea or vomiting  Post-op Vital Signs: stable  Complications: No apparent anesthesia complications

## 2011-08-20 NOTE — Brief Op Note (Signed)
08/20/2011  2:51 PM  PATIENT:  Kerin Salen  56 y.o. male  PRE-OPERATIVE DIAGNOSIS:  Osteoarthritis right hip  POST-OPERATIVE DIAGNOSIS:  osteoarthritis right hip  PROCEDURE:  Procedure(s) (LRB): TOTAL HIP ARTHROPLASTY ANTERIOR APPROACH (Right) STEROID INJECTION (Left)  SURGEON:  Surgeon(s) and Role:    * Kathryne Hitch, MD - Primary  PHYSICIAN ASSISTANT:   ASSISTANTS: none   ANESTHESIA:   spinal  EBL:  Total I/O In: 2800 [I.V.:2500; IV Piggyback:300] Out: 670 [Urine:70; Blood:600]  BLOOD ADMINISTERED:none  DRAINS: none   LOCAL MEDICATIONS USED:  NONE  SPECIMEN:  No Specimen  DISPOSITION OF SPECIMEN:  N/A  COUNTS:  YES  TOURNIQUET:  * No tourniquets in log *  DICTATION: .Other Dictation: Dictation Number [id number]  PLAN OF CARE: Admit to inpatient   PATIENT DISPOSITION:  PACU - hemodynamically stable.   Delay start of Pharmacological VTE agent (>24hrs) due to surgical blood loss or risk of bleeding: no

## 2011-08-20 NOTE — H&P (Signed)
[name] is an 56 y.o. male.   Chief Complaint:   Severe bilateral hip pain HPI:   56 yo with severe bilateral hip pain and x-rays showing significant OA.  He has failed conservative treatment and wishes to proceed with a right total hip replacement and a steroid injection in his left hip to temporize his pain.  The risks and benefits of surgery have been explained in great detail and he gives informed consent for surgery.  The goal are decreased pain and improved mobility.  The risks are infection, blood loss, DVT, and fracture.  Past Medical History  Diagnosis Date  . Prostate cancer   . Hypertension   . Hypercholesteremia   . Arthritis   . Sleep apnea     USES C-PAP    Past Surgical History  Procedure Date  . Prostate biopsy   . Prostatectomy 2012  . Achilles tendon repair 2009  . Knee arthrotomy 1976    LEFT    History reviewed. No pertinent family history. Social History:  reports that he has never smoked. He does not have any smokeless tobacco history on file. He reports that he drinks alcohol. He reports that he does not use illicit drugs.  Allergies:  Allergies  Allergen Reactions  . Lipitor (Atorvastatin) Cough    Medications Prior to Admission  Medication Sig Dispense Refill  . Cholecalciferol (VITAMIN D) 2000 UNITS CAPS Take 1 capsule by mouth daily.        . fish oil-omega-3 fatty acids 1000 MG capsule Take 2 g by mouth daily.       Marland Kitchen glucosamine-chondroitin 500-400 MG tablet Take 1 tablet by mouth 2 (two) times daily.      Marland Kitchen HYDROcodone-acetaminophen (NORCO/VICODIN) 5-325 MG per tablet Take 1-2 tablets by mouth every 6 (six) hours as needed. Pain      . ibuprofen (ADVIL,MOTRIN) 200 MG tablet Take 200 mg by mouth every 6 (six) hours as needed.      . Multiple Vitamins-Minerals (MULTIVITAMINS THER. W/MINERALS) TABS Take 1 tablet by mouth daily.        . valsartan-hydrochlorothiazide (DIOVAN-HCT) 160-25 MG per tablet Take 1 tablet by mouth daily with breakfast.          Results for orders placed during the hospital encounter of 08/20/11 (from the past 48 hour(s))  TYPE AND SCREEN     Status: Normal   Collection Time   08/20/11  9:45 AM      Component Value Range Comment   ABO/RH(D) O NEG      Antibody Screen NEG      Sample Expiration 08/23/2011      No results found.  Review of Systems  All other systems reviewed and are negative.    Blood pressure 148/95, pulse 64, temperature 96.9 F (36.1 C), temperature source Oral, resp. rate 20, SpO2 98.00%. Physical Exam  Constitutional: He is oriented to person, place, and time. He appears well-developed and well-nourished.  HENT:  Head: Normocephalic and atraumatic.  Eyes: EOM are normal. Pupils are equal, round, and reactive to light.  Neck: Normal range of motion. Neck supple.  Cardiovascular: Normal rate and regular rhythm.   Respiratory: Effort normal and breath sounds normal.  GI: Soft. Bowel sounds are normal.  Musculoskeletal:       Right hip: He exhibits decreased range of motion, decreased strength and crepitus.       Left hip: He exhibits decreased range of motion, decreased strength, bony tenderness and crepitus.  Neurological: He  is alert and oriented to person, place, and time.  Skin: Skin is warm and dry.  Psychiatric: He has a normal mood and affect.     Assessment/Plan End-stage OA bilateral hips with severe pain. 1) to the OR today for a right total hip replacement and a steroid injection in the left hip  [name] Y 08/20/2011, 12:17 PM

## 2011-08-21 LAB — BASIC METABOLIC PANEL
BUN: 12 mg/dL (ref 6–23)
CO2: 26 mEq/L (ref 19–32)
Chloride: 102 mEq/L (ref 96–112)
Creatinine, Ser: 0.88 mg/dL (ref 0.50–1.35)
GFR calc Af Amer: >90 mL/min (ref >90)
Potassium: 3.8 mEq/L (ref 3.5–5.1)

## 2011-08-21 LAB — CBC
HCT: 33.3 % — ABNORMAL LOW (ref 39.0–52.0)
MCV: 92.2 fL (ref 78.0–100.0)
RBC: 3.61 MIL/uL — ABNORMAL LOW (ref 4.22–5.81)
RDW: 12.6 % (ref 11.5–15.5)
WBC: 5.7 10*3/uL (ref 4.0–10.5)

## 2011-08-21 NOTE — Evaluation (Signed)
Physical Therapy Evaluation Patient Details Name: [name] MRN: [id number] DOB: April 29, 1955 Today's Date: 08/21/2011 Time: 4098-1191 PT Time Calculation (min): 32 min  PT Assessment / Plan / Recommendation Clinical Impression  Pt presents s/p R THA (ant approach) POD 1 with decreased strength, ROM and mobility.  Tolerated ambulation in hallway well with RW at min/guard level.  Pt will benefit from skilled PT in acute venue to address deficits.  PT recommends HHPT for follow up therapy at D/C to return pt to PLOF.     PT Assessment  Patient needs continued PT services    Follow Up Recommendations  Home health PT    Barriers to Discharge None      Equipment Recommendations  None recommended by PT    Recommendations for Other Services     Frequency 7X/week    Precautions / Restrictions Precautions Precautions: None Restrictions Weight Bearing Restrictions: No   Pertinent Vitals/Pain 4/10 prior to amb, 2-3/10 with ambulation.       Mobility  Bed Mobility Bed Mobility: Not assessed Details for Bed Mobility Assistance: Pt sitting on EOB when PT arrived.  Transfers Transfers: Sit to Stand;Stand to Sit Sit to Stand: 4: Min guard;From elevated surface;With upper extremity assist;From bed Stand to Sit: 4: Min guard;With upper extremity assist;With armrests;To chair/3-in-1 Details for Transfer Assistance: Min/guard for safety with cues for hand placement and LE management when sitting/standing.  Ambulation/Gait Ambulation/Gait Assistance: 4: Min guard Ambulation Distance (Feet): 180 Feet Assistive device: Rolling walker Ambulation/Gait Assistance Details: Cues for sequencing/technique with RW, upright posture and turning towards stronger LE.   Gait Pattern: Step-to pattern;Decreased stride length Stairs: No Wheelchair Mobility Wheelchair Mobility: No    Exercises     PT Diagnosis: Difficulty walking;Generalized weakness;Acute pain  PT Problem List: Decreased  strength;Decreased range of motion;Decreased mobility;Decreased activity tolerance;Decreased knowledge of use of DME;Pain PT Treatment Interventions: DME instruction;Gait training;Stair training;Functional mobility training;Therapeutic activities;Therapeutic exercise;Balance training;Patient/family education   PT Goals Acute Rehab PT Goals PT Goal Formulation: With patient Time For Goal Achievement: 08/23/11 Potential to Achieve Goals: Good Pt will go Supine/Side to Sit: with supervision PT Goal: Supine/Side to Sit - Progress: Goal set today Pt will go Sit to Supine/Side: with supervision PT Goal: Sit to Supine/Side - Progress: Goal set today Pt will go Sit to Stand: with modified independence PT Goal: Sit to Stand - Progress: Goal set today Pt will Ambulate: >150 feet;with modified independence;with least restrictive assistive device PT Goal: Ambulate - Progress: Goal set today Pt will Go Up / Down Stairs: 1-2 stairs;with supervision;with least restrictive assistive device PT Goal: Up/Down Stairs - Progress: Goal set today  Visit Information  Last PT Received On: 08/21/11 Assistance Needed: +1    Subjective Data  Subjective: Are you the physical therapist Patient Stated Goal: to get home   Prior Functioning  Home Living Lives With: Spouse Available Help at Discharge: Family Type of Home: House Home Access: Stairs to enter Secretary/administrator of Steps: 1 Entrance Stairs-Rails: None Home Layout: One level Bathroom Shower/Tub: Health visitor: Standard Bathroom Accessibility: Yes How Accessible: Accessible via walker Home Adaptive Equipment: Crutches;Straight cane;Walker - rolling Additional Comments: has toilet riser Prior Function Level of Independence: Independent Able to Take Stairs?: Yes Driving: Yes Communication Communication: No difficulties    Cognition  Overall Cognitive Status: Appears within functional limits for tasks  assessed/performed Arousal/Alertness: Awake/alert Orientation Level: Appears intact for tasks assessed Behavior During Session: [name] for tasks performed    Extremity/Trunk Assessment Right Lower  Extremity Assessment RLE ROM/Strength/Tone: Deficits RLE ROM/Strength/Tone Deficits: WFL per assessment, ankle motions 5/5, hip flex at least 90 deg per observation when sitting.  RLE Sensation: WFL - Light Touch RLE Coordination: WFL - gross motor Left Lower Extremity Assessment LLE ROM/Strength/Tone: WFL for tasks assessed LLE Sensation: WFL - Light Touch LLE Coordination: WFL - gross motor Trunk Assessment Trunk Assessment: Normal   Balance    End of Session PT - End of Session Activity Tolerance: Patient tolerated treatment well Patient left: in chair;with call bell/phone within reach;with family/visitor present Nurse Communication: Mobility status  GP     [name] 08/21/2011, 8:52 AM

## 2011-08-21 NOTE — Progress Notes (Signed)
Per pt choice [name] to provide [facility] services. [name] rep spoke with patient at bedside. Pt agrees with dc plan. No other needs specified at this time.    Leonie Green [phone]

## 2011-08-21 NOTE — Op Note (Signed)
[name], [name] NO.:  [id number]  MEDICAL RECORD NO.:  [id number]  LOCATION:  1619                         FACILITY:  [facility]  PHYSICIAN:  Vanita Panda. Magnus Ivan, M.D.DATE OF BIRTH:  [birth year]  DATE OF PROCEDURE:  08/20/2011 DATE OF DISCHARGE:                              OPERATIVE REPORT   PREOPERATIVE DIAGNOSES: 1. End-stage arthritis and degenerative joint disease, right hip. 2. End-stage arthritis and degenerative joint disease, left hip.  POSTOPERATIVE DIAGNOSES: 1. End-stage arthritis and degenerative joint disease, right hip. 2. End-stage arthritis and degenerative joint disease, left hip.  PROCEDURES: 1. Right total hip arthroplasty through direct anterior approach. 2. Steroid injection 3 mL of 0.25% plain Sensorcaine mixed with 1 mL     of 40 of Depo-Medrol.  IMPLANTS:  DePuy Sector Gription acetabular component size 58, size 36, +4 neutral polyethylene liner, size 13 Corail femoral component (KLA), size 36, +1.5 ceramic hip ball.  SURGEON:  Vanita Panda. Magnus Ivan, MD  ANESTHESIA:  Spinal.  BLOOD LOSS:  750 mL.  COMPLICATIONS:  None.  INDICATIONS:  [name] is a 56 year old gentleman who has well- documented end-stage arthritis involving both of his hips.  He states his right hip is worse than the left hip, and he would like to proceed with a total hip arthroplasty on the right side, and while he is under anesthesia, a steroid injection in his left hip.  He understands the risks and benefits of surgery in detail and does wish to proceed with surgery given the amount of pain that he is having.  PROCEDURE DESCRIPTION:  After informed consent was obtained, the right and left hips were marked.  He was brought to the operating room and spinal anesthesia was obtained while he was on the stretcher.  A catheter was placed in his __________.  A time-out was called, and he was identified as correct patient, correct left hip for the  injection. We prepped the left hip with Betadine and alcohol prior to injection into the hip joint without difficulties.  We then placed a Band-Aid over this.  Traction boots were then placed on his feet and he was placed supine on the Hana fracture table with a perineal post in place as well both legs were in in-line skeletal traction with no traction applied. His right hip was then assessed under direct fluoroscopy and found to have severe arthritis.  We then prepped the right hip with DuraPrep and sterile drapes.  A time-out was called, and he was identified as correct patient, correct right hip for hip replacement.  I then made an incision 1 fingerbreadth distal and 3 fingerbreadths posterior to the anterior- superior iliac spine and carried this obliquely down the leg.  I dissected down to the soft tissue to the tensor fascia lata.  The tensor fascia was then divided obliquely and I proceeded with a direct anterior approach to the hip.  A Cobra retractor was placed around the lateral neck and then up underneath the rectus femoris, a medial retractor was placed.  I found the lateral femoral circumflex vessels and cauterized these.  I then made my capsular cut from superolateral down and then to the medial.  I  placed the Cobra retractors within my hip capsule.  Next, I made my femoral neck cut with an oscillating saw just proximal to the lesser trochanter and completed this cut with an osteotome.  A corkscrew guide was placed in the femoral head, and it was removed in its entirety with significant debris were in the femoral head and acetabulum.  I then cleaned the acetabulum of debris.  Placed a bent Hohmann medially and a Cobra retractor laterally and then began reaming from size 46.  Then, 2 mm increments all the way to a 48 due to his large pelvis.  All reamers were placed under direct visualization with the last 2 reamers were also placed under fluoroscopy site, and we could obtain  my depth of reaming and my inclination and abduction.  Once I was satisfied with this position, I placed the real size 50 acetabular component without difficulty, next the apex hole eliminator guide and then the real 36 + 4 neutral polyethylene liner.  Attention was then turned to the femur. Then, __________ externally rotated to 90 degrees, extended and adducted to allow access to the femoral canal.  I did release the lateral capsule as well as the piriformis and then used a box cutting guide at the end of femoral canal, lateralized with the rongeur and was able to broach from a size 8 broach up to a size 13 with a 13 felt to be stable.  I trialed a 36 + 1.5 hip ball and reduced this back in the acetabulum. His leg lengths were measured near equal.  I __________ removed all trial components after dislocating the hip, and placed the real HA- coated Corail femoral component with varus offset (KLA).  I placed the real 36 + 1.5 ceramic hip ball and reduced this back in the acetabulum and it was stable.  I then copiously irrigated the soft tissues with normal saline solution.  I closed the joint capsule remnants with a #1 Ethibond suture.  I used a running 0 V lock suture in the tensor fascia lata, 2-0 Vicryl in subcutaneous tissue, and staples on the skin.  Well- padded sterile dressing was applied and he was taken off of the Hana table into the recovery room in stable condition.  All final counts were correct.  There were no complications noted.     Vanita Panda. Magnus Ivan, M.D.     CYB/MEDQ  D:  08/20/2011  T:  08/21/2011  Job:  [id number]

## 2011-08-21 NOTE — Progress Notes (Signed)
Cm spoke with patient with spouse at bedside concerning dc planning. Pt offered choice for [facility]. No choice made at this time. Pt request Rw, [facility]SC. [facility] notified of DME referral. Md orders faxed to [facility] at [phone]. No other needs specified at this time.   Leonie Green [phone]

## 2011-08-21 NOTE — Progress Notes (Signed)
Subjective: 1 Day Post-Op Procedure(s) (LRB): TOTAL HIP ARTHROPLASTY ANTERIOR APPROACH (Right) STEROID INJECTION (Left) Patient reports pain as 5 on 0-10 scale.    Objective: Vital signs in last 24 hours: Temp:  [97.7 F (36.5 C)-98.9 F (37.2 C)] 98.9 F (37.2 C) (08/10 1416) Pulse Rate:  [60-82] 82  (08/10 1416) Resp:  [12-16] 14  (08/10 1416) BP: (110-161)/(58-88) 127/76 mmHg (08/10 1416) SpO2:  [93 %-100 %] 93 % (08/10 1416) Weight:  [107.502 kg (237 lb)] 107.502 kg (237 lb) (08/09 1652)  Intake/Output from previous day: 08/09 0701 - 08/10 0700 In: 4764.8 [P.O.:360; I.V.:3749.8; IV Piggyback:655] Out: 2570 [Urine:1970; Blood:600] Intake/Output this shift: Total I/O In: 360 [P.O.:360] Out: 275 [Urine:275]   Basename 08/21/11 0550  HGB 11.6*    Basename 08/21/11 0550  WBC 5.7  RBC 3.61*  HCT 33.3*  PLT 168    Basename 08/21/11 0550  NA 135  K 3.8  CL 102  CO2 26  BUN 12  CREATININE 0.88  GLUCOSE 126*  CALCIUM 8.2*   No results found for this basename: LABPT:2,INR:2 in the last 72 hours  Neurologically intact Compartment soft  Assessment/Plan: 1 Day Post-Op Procedure(s) (LRB): TOTAL HIP ARTHROPLASTY ANTERIOR APPROACH (Right) STEROID INJECTION (Left) Up with therapy D/C IV fluids  Erine Phenix C 08/21/2011, 4:00 PM

## 2011-08-21 NOTE — Progress Notes (Signed)
Physical Therapy Treatment Patient Details Name: Zyan Coby MRN: [id number] DOB: 1955-07-07 Today's Date: 08/21/2011 Time: 0865-7846 PT Time Calculation (min): 25 min  PT Assessment / Plan / Recommendation Comments on Treatment Session  Pt progressing well with ambulation and exercises.      Follow Up Recommendations  Home health PT    Barriers to Discharge        Equipment Recommendations  3 in 1 bedside comode;None recommended by PT;Other (comment)    Recommendations for Other Services    Frequency 7X/week   Plan Discharge plan remains appropriate    Precautions / Restrictions Precautions Precautions: None Restrictions Weight Bearing Restrictions: No Other Position/Activity Restrictions: WBAT   Pertinent Vitals/Pain 4/10    Mobility  Bed Mobility Bed Mobility: Sit to Supine Supine to Sit: 5: Supervision Sit to Supine: 4: Min assist;HOB flat Details for Bed Mobility Assistance: Assist for R LE out of bed with cues for adjusting hips once in bed.  Transfers Transfers: Sit to Stand;Stand to Sit Sit to Stand: 5: Supervision;With upper extremity assist;With armrests;From chair/3-in-1 Stand to Sit: 5: Supervision;With upper extremity assist;To elevated surface;To bed Details for Transfer Assistance: Min cues for hand placement and LE management.  Ambulation/Gait Ambulation/Gait Assistance: 4: Min guard Ambulation Distance (Feet): 160 Feet Assistive device: Rolling walker Ambulation/Gait Assistance Details: Min cues for sequencing/technique and upright posture.  Gait Pattern: Step-to pattern;Decreased stride length    Exercises Total Joint Exercises Ankle Circles/Pumps: AROM;Both;20 reps Quad Sets: AROM;Right;10 reps Heel Slides: AROM;Strengthening;Right;10 reps Hip ABduction/ADduction: AROM;Strengthening;Right;10 reps   PT Diagnosis:    PT Problem List:   PT Treatment Interventions:     PT Goals Acute Rehab PT Goals PT Goal Formulation: With patient Time  For Goal Achievement: 08/23/11 Potential to Achieve Goals: Good Pt will go Supine/Side to Sit: with supervision PT Goal: Supine/Side to Sit - Progress: Progressing toward goal Pt will go Sit to Supine/Side: with supervision PT Goal: Sit to Supine/Side - Progress: Progressing toward goal Pt will go Sit to Stand: with modified independence PT Goal: Sit to Stand - Progress: Progressing toward goal Pt will Ambulate: >150 feet;with modified independence;with least restrictive assistive device PT Goal: Ambulate - Progress: Progressing toward goal  Visit Information  Last PT Received On: 08/21/11 Assistance Needed: +1    Subjective Data  Subjective: I'm ready Patient Stated Goal: to get home   Cognition  Overall Cognitive Status: Appears within functional limits for tasks assessed/performed Arousal/Alertness: Awake/alert Orientation Level: Appears intact for tasks assessed Behavior During Session: [facility] for tasks performed    Balance     End of Session PT - End of Session Activity Tolerance: Patient tolerated treatment well Patient left: in bed;with call bell/phone within reach Nurse Communication: Mobility status   GP     [name], [name] 08/21/2011, 5:13 PM

## 2011-08-21 NOTE — Progress Notes (Signed)
CPAP setup for pt to use at night. Pt unaware of setting, so started at 10 cmH2O with his circuit & mask. Pt says this feels great. 2L O2 bleed in, SpO2-95% at this time.  Jacqulynn Cadet RRT

## 2011-08-21 NOTE — Evaluation (Signed)
Occupational Therapy Evaluation Patient Details Name: [name] MRN: [id number] DOB: 07-Jan-1956 Today's Date: 08/21/2011 Time: 4098-1191 OT Time Calculation (min): 22 min  OT Assessment / Plan / Recommendation Clinical Impression  Pt is s/p R direct anterior hip replacement and displays decreased ADL independence. Will benefit from skilled OT services to improve ADL independence.     OT Assessment  Patient needs continued OT Services    Follow Up Recommendations  No OT follow up    Barriers to Discharge      Equipment Recommendations  3 in 1 bedside comode;Other (comment) (elongated 3in1 if possible.)    Recommendations for Other Services    Frequency  Min 2X/week    Precautions / Restrictions Precautions Precautions: None Restrictions Weight Bearing Restrictions: No        ADL  Eating/Feeding: Simulated;Independent Where Assessed - Eating/Feeding: Chair Grooming: Simulated;Set up Where Assessed - Grooming: Supported sitting Upper Body Bathing: Simulated;Chest;Right arm;Left arm;Abdomen;Set up Where Assessed - Upper Body Bathing: Unsupported sitting Lower Body Bathing: Simulated;Minimal assistance Where Assessed - Lower Body Bathing: Supported sit to stand Upper Body Dressing: Simulated;Set up Where Assessed - Upper Body Dressing: Unsupported sitting Lower Body Dressing: Simulated;Minimal assistance Where Assessed - Lower Body Dressing: Supported sit to stand Toilet Transfer: Performed;Minimal assistance Toilet Transfer Method: Other (comment) (ambulating) Toilet Transfer Equipment: Raised toilet seat with arms (or 3-in-1 over toilet) Toileting - Clothing Manipulation and Hygiene: Simulated;Min guard Where Assessed - Glass blower/designer Manipulation and Hygiene: Standing Tub/Shower Transfer Method: Not assessed Equipment Used: Rolling walker ADL Comments: Educated on AE options available. Pt interested in AE if needed even though significant other can assist. Pt  very close to reaching down to feet fully to don R sock. Able to touch to top of sock. May not need AE by time of discharge. Pt needed min cues for safety to not get up before RW fully in front of him.     OT Diagnosis: Generalized weakness  OT Problem List: Decreased strength;Decreased knowledge of use of DME or AE;Pain OT Treatment Interventions: Self-care/ADL training;Therapeutic activities;DME and/or AE instruction;Patient/family education   OT Goals Acute Rehab OT Goals OT Goal Formulation: With patient Time For Goal Achievement: 08/28/11 Potential to Achieve Goals: Good ADL Goals Pt Will Perform Grooming: with supervision;Standing at sink ADL Goal: Grooming - Progress: Goal set today Pt Will Perform Lower Body Bathing: with supervision;Sit to stand from bed;Sit to stand from chair;with adaptive equipment;Other (comment) (AE PRN) ADL Goal: Lower Body Bathing - Progress: Goal set today Pt Will Perform Lower Body Dressing: with supervision;with adaptive equipment;Sit to stand from bed;Sit to stand from chair;Other (comment) (AE PRN) ADL Goal: Lower Body Dressing - Progress: Goal set today Pt Will Transfer to Toilet: with supervision;Ambulation;with DME;3-in-1 ADL Goal: Toilet Transfer - Progress: Goal set today Pt Will Perform Tub/Shower Transfer: with supervision;with DME;Shower transfer ADL Goal: Web designer - Progress: Goal set today  Visit Information  Last OT Received On: 08/21/11 Assistance Needed: +1    Subjective Data  Subjective: I need to try to have a bowel movement Patient Stated Goal: none stated. agreeable up with OT   Prior Functioning  Vision/Perception  Home Living Lives With: Spouse Available Help at Discharge: Family Type of Home: House Home Access: Stairs to enter Secretary/administrator of Steps: 1 Entrance Stairs-Rails: None Home Layout: One level Bathroom Shower/Tub: Health visitor: Standard Bathroom Accessibility: Yes How  Accessible: Accessible via walker Home Adaptive Equipment: Crutches;Straight cane;Walker - rolling;Shower chair with back;Other (comment) (toilet riser  no handles) Prior Function Level of Independence: Independent Able to Take Stairs?: Yes Driving: Yes Communication Communication: No difficulties      Cognition  Overall Cognitive Status: Appears within functional limits for tasks assessed/performed Arousal/Alertness: Awake/alert Orientation Level: Appears intact for tasks assessed Behavior During Session: [facility] for tasks performed    Extremity/Trunk Assessment Right Upper Extremity Assessment RUE ROM/Strength/Tone: [facility] for tasks assessed Left Upper Extremity Assessment LUE ROM/Strength/Tone: WFL for tasks assessed   Mobility Bed Mobility Bed Mobility: Supine to Sit Supine to Sit: 5: Supervision Transfers Transfers: Sit to Stand;Stand to Sit Sit to Stand: 4: Min guard;From chair/3-in-1;From bed Stand to Sit: 4: Min assist;To chair/3-in-1 Details for Transfer Assistance: min verbal cues for hand placement and LE management.    Exercise    Balance    End of Session OT - End of Session Activity Tolerance: Patient tolerated treatment well Patient left: in chair;with call bell/phone within reach;with family/visitor present  GO     Lennox Laity [phone] 08/21/2011, 3:25 PM

## 2011-08-22 LAB — CBC
HCT: 30.6 % — ABNORMAL LOW (ref 39.0–52.0)
Hemoglobin: 10.6 g/dL — ABNORMAL LOW (ref 13.0–17.0)
MCV: 91.1 fL (ref 78.0–100.0)
WBC: 7 10*3/uL (ref 4.0–10.5)

## 2011-08-22 NOTE — Progress Notes (Signed)
Pt has already placed himself on cpap for the night & tolerating well. Pt on 10 cmH2O with his circuit/mask from home.  Jacqulynn Cadet RRT

## 2011-08-22 NOTE — Progress Notes (Signed)
Subjective: 2 Days Post-Op Procedure(s) (LRB): TOTAL HIP ARTHROPLASTY ANTERIOR APPROACH (Right) STEROID INJECTION (Left) Patient reports pain as moderate.    Objective: Vital signs in last 24 hours: Temp:  [98.9 F (37.2 C)-99.8 F (37.7 C)] 99.8 F (37.7 C) (08/11 0526) Pulse Rate:  [72-83] 72  (08/11 0526) Resp:  [14-20] 16  (08/11 0751) BP: (112-127)/(63-76) 112/63 mmHg (08/11 0526) SpO2:  [93 %-99 %] 98 % (08/11 0751)  Intake/Output from previous day: 08/10 0701 - 08/11 0700 In: 2202.3 [P.O.:480; I.V.:1722.3] Out: 925 [Urine:925] Intake/Output this shift: Total I/O In: 240 [P.O.:240] Out: -    Basename 08/22/11 0504 08/21/11 0550  HGB 10.6* 11.6*    Basename 08/22/11 0504 08/21/11 0550  WBC 7.0 5.7  RBC 3.36* 3.61*  HCT 30.6* 33.3*  PLT 164 168    Basename 08/21/11 0550  NA 135  K 3.8  CL 102  CO2 26  BUN 12  CREATININE 0.88  GLUCOSE 126*  CALCIUM 8.2*   No results found for this basename: LABPT:2,INR:2 in the last 72 hours  Neurologically intact  Assessment/Plan: 2 Days Post-Op Procedure(s) (LRB): TOTAL HIP ARTHROPLASTY ANTERIOR APPROACH (Right) STEROID INJECTION (Left) Up with therapy  Dressing change  D/c iv  Afsana Liera C 08/22/2011, 9:58 AM

## 2011-08-22 NOTE — Progress Notes (Signed)
Occupational Therapy Treatment Patient Details Name: [name] MRN: [id number] DOB: March 25, 1955 Today's Date: 08/22/2011 Time: 1045-1100 OT Time Calculation (min): 15 min  OT Assessment / Plan / Recommendation Comments on Treatment Session Focus of session today on shower transfer and AE. Pt progressing well, but limited in session today by upset stomach. Next session to focus on performance of shower transfer and LB dsg.    Follow Up Recommendations  No OT follow up    Barriers to Discharge       Equipment Recommendations  None recommended by PT;3 in 1 bedside comode (elongated if possible)    Recommendations for Other Services    Frequency     Plan Discharge plan remains appropriate    Precautions / Restrictions Precautions Precautions: None Restrictions Weight Bearing Restrictions: No Other Position/Activity Restrictions: WBAT   Pertinent Vitals/Pain Pt reports upset stomach and states RN is aware. Pt reports 5/10 RLE pain and reports he is pre-medicated    ADL  Toilet Transfer: Performed;Supervision/safety Toilet Transfer Method: Sit to Barista: Raised toilet seat with arms (or 3-in-1 over toilet) Toileting - Clothing Manipulation and Hygiene: Performed;Supervision/safety Where Assessed - Glass blower/designer Manipulation and Hygiene: Standing Equipment Used: Gait belt;Rolling walker Transfers/Ambulation Related to ADLs: Pt supervision with RW ambulation chair to bathroom ADL Comments: Educated/demonstrated AE to pt. Pt declined practice but states he plans to purchase. Educated and demonstrated backwards shower entry- pt declined performing this secondary to upset stomach and needing to use the restroom. However, pt able to verbalize method back to therapist on way to bathroom    OT Diagnosis:    OT Problem List:   OT Treatment Interventions:     OT Goals ADL Goals ADL Goal: Lower Body Bathing - Progress: Progressing toward goals ADL Goal:  Lower Body Dressing - Progress: Progressing toward goals ADL Goal: Toilet Transfer - Progress: Met ADL Goal: Tub/Shower Transfer - Progress: Progressing toward goals  Visit Information  Last OT Received On: 08/22/11 Assistance Needed: +1    Subjective Data  Subjective: My stomach has really been bothering me today   Prior Functioning       Cognition  Overall Cognitive Status: Appears within functional limits for tasks assessed/performed Arousal/Alertness: Awake/alert Orientation Level: Appears intact for tasks assessed Behavior During Session: [facility] for tasks performed    Mobility Bed Mobility Bed Mobility: Not assessed Transfers Sit to Stand: 5: Supervision;From elevated surface;With upper extremity assist;With armrests;From chair/3-in-1 Stand to Sit: 5: Supervision;With upper extremity assist;With armrests;To chair/3-in-1 Details for Transfer Assistance: Min cues for LE management with stand to sit   Exercises Total Joint Exercises Ankle Circles/Pumps: AROM;Both;20 reps Quad Sets: AROM;Right;10 reps Heel Slides: AROM;Strengthening;Right;10 reps Hip ABduction/ADduction: AROM;Strengthening;Right;10 reps  Balance    End of Session OT - End of Session Equipment Utilized During Treatment: Gait belt Activity Tolerance: Patient tolerated treatment well (pt limited by upset stomach) Patient left:  (sitting on toilet, instructed to use call string if needed)  GO     [name] 08/22/2011, 1:03 PM

## 2011-08-22 NOTE — Progress Notes (Signed)
Physical Therapy Treatment Patient Details Name: [name] MRN: [id number] DOB: [birth year] Today's Date: 08/22/2011 Time: 6578-4696 PT Time Calculation (min): 14 min  PT Assessment / Plan / Recommendation Comments on Treatment Session  Pt did very well with stair training.  Will be ready for D/C tomorrow.     Follow Up Recommendations  Home health PT    Barriers to Discharge        Equipment Recommendations  None recommended by PT;3 in 1 bedside comode    Recommendations for Other Services    Frequency 7X/week   Plan Discharge plan remains appropriate    Precautions / Restrictions Precautions Precautions: None Restrictions Weight Bearing Restrictions: No Other Position/Activity Restrictions: WBAT   Pertinent Vitals/Pain 5/10, RN notified.     Mobility  Bed Mobility Bed Mobility: Sit to Supine Sit to Supine: 4: Min assist;HOB flat Details for Bed Mobility Assistance: Assist for R LE into bed.  Transfers Transfers: Sit to Stand;Stand to Sit Sit to Stand: 6: Modified independent (Device/Increase time);With upper extremity assist;From chair/3-in-1 Stand to Sit: 6: Modified independent (Device/Increase time);With upper extremity assist;To bed Details for Transfer Assistance: Min cues for LE management with stand to sit Ambulation/Gait Ambulation/Gait Assistance: 5: Supervision Ambulation Distance (Feet): 150 Feet Assistive device: Rolling walker Ambulation/Gait Assistance Details: Supervision for safety with min cues for relaxed posture Gait Pattern: Step-to pattern;Decreased stride length Stairs: Yes Stairs Assistance: 4: Min guard Stair Management Technique: No rails;Step to pattern;Backwards;Forwards;With walker Number of Stairs: 2     Exercises     PT Diagnosis:    PT Problem List:   PT Treatment Interventions:     PT Goals Acute Rehab PT Goals PT Goal Formulation: With patient Time For Goal Achievement: 08/23/11 Potential to Achieve Goals: Good Pt  will go Sit to Supine/Side: with supervision PT Goal: Sit to Supine/Side - Progress: Progressing toward goal Pt will go Sit to Stand: with modified independence PT Goal: Sit to Stand - Progress: Met Pt will Ambulate: >150 feet;with modified independence;with least restrictive assistive device PT Goal: Ambulate - Progress: Progressing toward goal Pt will Go Up / Down Stairs: 1-2 stairs;with supervision;with least restrictive assistive device PT Goal: Up/Down Stairs - Progress: Progressing toward goal  Visit Information  Last PT Received On: 08/22/11 Assistance Needed: +1    Subjective Data  Subjective: That wasn't so bad (in reference to the stairs) Patient Stated Goal: to get home   Cognition  Overall Cognitive Status: Appears within functional limits for tasks assessed/performed Arousal/Alertness: Awake/alert Orientation Level: Appears intact for tasks assessed Behavior During Session: [facility] for tasks performed    Balance     End of Session PT - End of Session Activity Tolerance: Patient limited by pain Patient left: in bed;with call bell/phone within reach Nurse Communication: Patient requests pain meds   GP     Page, [name] 08/22/2011, 3:13 PM

## 2011-08-22 NOTE — Progress Notes (Signed)
Physical Therapy Treatment Patient Details Name: [name] MRN: [id number] DOB: Nov 21, 1955 Today's Date: 08/22/2011 Time: 4403-4742 PT Time Calculation (min): 23 min  PT Assessment / Plan / Recommendation Comments on Treatment Session  Pt continues to do well with ambulation, increasing distance and exercises.  Will practice stairs this afternoon.     Follow Up Recommendations  Home health PT    Barriers to Discharge        Equipment Recommendations  3 in 1 bedside comode;None recommended by PT;Other (comment)    Recommendations for Other Services    Frequency 7X/week   Plan Discharge plan remains appropriate    Precautions / Restrictions Precautions Precautions: None Restrictions Weight Bearing Restrictions: No Other Position/Activity Restrictions: WBAT   Pertinent Vitals/Pain 3/10    Mobility  Bed Mobility Bed Mobility: Not assessed Transfers Transfers: Sit to Stand;Stand to Sit Sit to Stand: 5: Supervision;From elevated surface;With upper extremity assist;With armrests;From chair/3-in-1 Stand to Sit: 5: Supervision;With upper extremity assist;With armrests;To chair/3-in-1 Details for Transfer Assistance: Min cues for hand placement.  Ambulation/Gait Ambulation/Gait Assistance: 5: Supervision Ambulation Distance (Feet): 180 Feet Assistive device: Rolling walker Ambulation/Gait Assistance Details: Min cues for relaxed and upright posture.  Gait Pattern: Step-to pattern;Decreased stride length Stairs: No    Exercises Total Joint Exercises Ankle Circles/Pumps: AROM;Both;20 reps Quad Sets: AROM;Right;10 reps Heel Slides: AROM;Strengthening;Right;10 reps Hip ABduction/ADduction: AROM;Strengthening;Right;10 reps   PT Diagnosis:    PT Problem List:   PT Treatment Interventions:     PT Goals Acute Rehab PT Goals PT Goal Formulation: With patient Time For Goal Achievement: 08/23/11 Potential to Achieve Goals: Good Pt will go Sit to Stand: with modified  independence PT Goal: Sit to Stand - Progress: Progressing toward goal Pt will Ambulate: >150 feet;with modified independence;with least restrictive assistive device PT Goal: Ambulate - Progress: Progressing toward goal  Visit Information  Last PT Received On: 08/22/11 Assistance Needed: +1    Subjective Data  Subjective: Slowly but surely Patient Stated Goal: to get home   Cognition  Overall Cognitive Status: Appears within functional limits for tasks assessed/performed Arousal/Alertness: Awake/alert Orientation Level: Appears intact for tasks assessed Behavior During Session: [facility] for tasks performed    Balance     End of Session PT - End of Session Activity Tolerance: Patient tolerated treatment well Patient left: in chair;with call bell/phone within reach Nurse Communication: Mobility status   GP     Page, [name] 08/22/2011, 9:30 AM

## 2011-08-23 ENCOUNTER — Encounter (HOSPITAL_COMMUNITY): Payer: Self-pay | Admitting: Orthopaedic Surgery

## 2011-08-23 LAB — CBC
HCT: 33.8 % — ABNORMAL LOW (ref 39.0–52.0)
Hemoglobin: 11.7 g/dL — ABNORMAL LOW (ref 13.0–17.0)
MCV: 91.4 fL (ref 78.0–100.0)
RDW: 12.6 % (ref 11.5–15.5)
WBC: 7.2 10*3/uL (ref 4.0–10.5)

## 2011-08-23 MED ORDER — OXYCODONE-ACETAMINOPHEN 5-325 MG PO TABS: 1.0000 | ORAL_TABLET | ORAL | Status: AC | PRN

## 2011-08-23 MED ORDER — ASPIRIN 325 MG PO TBEC: 325.0000 mg | DELAYED_RELEASE_TABLET | Freq: Two times a day (BID) | ORAL | Status: AC

## 2011-08-23 MED ORDER — METHOCARBAMOL 500 MG PO TABS: 500.0000 mg | ORAL_TABLET | Freq: Four times a day (QID) | ORAL | Status: AC | PRN

## 2011-08-23 NOTE — Progress Notes (Signed)
Patient ID: [name], male   DOB: 09/10/1955, 56 y.o.   MRN: [id number] Looks good. D/C to home today.

## 2011-08-23 NOTE — Discharge Summary (Signed)
Patient ID: [name] MRN: [id number] DOB/AGE: May 08, 1955 56 y.o.  Admit date: 08/20/2011 Discharge date: 08/23/2011  Admission Diagnoses:  Principal Problem:  *Degenerative arthritis of hip   Discharge Diagnoses:  Same  Past Medical History  Diagnosis Date  . Prostate cancer   . Hypertension   . Hypercholesteremia   . Arthritis   . Sleep apnea     USES C-PAP    Surgeries: Procedure(s): TOTAL HIP ARTHROPLASTY ANTERIOR APPROACH STEROID INJECTION on 08/20/2011   Consultants:    Discharged Condition: Improved  Hospital Course: [name] is an 56 y.o. male who was admitted 08/20/2011 for operative treatment ofDegenerative arthritis of hip. Patient has severe unremitting pain that affects sleep, daily activities, and work/hobbies. After pre-op clearance the patient was taken to the operating room on 08/20/2011 and underwent  Procedure(s): TOTAL HIP ARTHROPLASTY ANTERIOR APPROACH STEROID INJECTION.    Patient was given perioperative antibiotics: Anti-infectives     Start     Dose/Rate Route Frequency Ordered Stop   08/20/11 1845   ceFAZolin (ANCEF) IVPB 1 g/50 mL premix        1 g 100 mL/hr over 30 Minutes Intravenous Every 6 hours 08/20/11 1617 08/21/11 0023   08/20/11 1035   ceFAZolin (ANCEF) 3 g in dextrose 5 % 50 mL IVPB  Status:  Discontinued        3 g 160 mL/hr over 30 Minutes Intravenous 60 min pre-op 08/20/11 1035 08/20/11 1540           Patient was given sequential compression devices, early ambulation, and chemoprophylaxis to prevent DVT.  Patient benefited maximally from hospital stay and there were no complications.    Recent vital signs: Patient Vitals for the past 24 hrs:  BP Temp Temp src Pulse Resp SpO2  08/23/11 0553 152/78 mmHg 98.7 F (37.1 C) Oral 82  18  98 %  08/23/11 0000 - - - - 18  97 %  09/06/11 2059 125/74 mmHg 99.4 F (37.4 C) Oral 80  20  97 %  2011-09-06 2000 - - - - 18  97 %  09-06-2011 1600 - - - - 16  94 %  September 06, 2011 1516 129/70  mmHg 98 F (36.7 C) Oral 81  16  95 %  09/06/11 1133 - - - - 16  96 %  09-06-11 1041 117/70 mmHg 98.5 F (36.9 C) Oral 77  16  96 %  2011-09-06 0751 - - - - 16  98 %     Recent laboratory studies:  Basename 08/23/11 0423 09/06/11 0504 08/21/11 0550  WBC 7.2 7.0 --  HGB 11.7* 10.6* --  HCT 33.8* 30.6* --  PLT 213 164 --  NA -- -- 135  K -- -- 3.8  CL -- -- 102  CO2 -- -- 26  BUN -- -- 12  CREATININE -- -- 0.88  GLUCOSE -- -- 126*  INR -- -- --  CALCIUM -- -- 8.2*     Discharge Medications:   Medication List  As of 08/23/2011  6:59 AM   STOP taking these medications         HYDROcodone-acetaminophen 5-325 MG per tablet      ibuprofen 200 MG tablet         TAKE these medications         aspirin 325 MG EC tablet   Take 1 tablet (325 mg total) by mouth 2 (two) times daily.      fish oil-omega-3 fatty acids 1000 MG capsule  Take 2 g by mouth daily.      glucosamine-chondroitin 500-400 MG tablet   Take 1 tablet by mouth 2 (two) times daily.      methocarbamol 500 MG tablet   Commonly known as: ROBAXIN   Take 1 tablet (500 mg total) by mouth every 6 (six) hours as needed.      multivitamins ther. w/minerals Tabs   Take 1 tablet by mouth daily.      oxyCODONE-acetaminophen 5-325 MG per tablet   Commonly known as: PERCOCET/ROXICET   Take 1-2 tablets by mouth every 4 (four) hours as needed for pain.      valsartan-hydrochlorothiazide 160-25 MG per tablet   Commonly known as: DIOVAN-HCT   Take 1 tablet by mouth daily with breakfast.      Vitamin D 2000 UNITS Caps   Take 1 capsule by mouth daily.            Diagnostic Studies: Dg Chest 2 View  08/10/2011  *RADIOLOGY REPORT*  Clinical Data: Preop.  CHEST - 2 VIEW  Comparison: 12/21/2010.  Findings: Trachea is midline.  Heart size normal.  Lungs are clear. No pleural fluid.  IMPRESSION: No acute findings.  Original Report Authenticated By: Reyes Ivan, M.D.   Dg Hip Complete Right  08/20/2011  *RADIOLOGY  REPORT*  Clinical Data: Right hip arthroplasty.  OPERATIVE RIGHT HIP - 1 VIEW  Comparison: None.  Findings: 2 spot images from the C-arm fluoroscopic device, AP views of the lower pelvis and right hip, were submitted for interpretation post-operatively.  Anatomic alignment post right hip arthroplasty.  No visible complicating features.  IMPRESSION: Anatomic alignment post right hip arthroplasty.  Original Report Authenticated By: Arnell Sieving, M.D.   Dg Pelvis Portable  08/20/2011  *RADIOLOGY REPORT*  Clinical Data: Right total hip replacement  PORTABLE PELVIS  Comparison: None.  Findings: Portable view of the lower pelvis obtained at 1513 hours shows the patient be status post right total hip replacement.  No evidence for immediate hardware complications.  Soft tissue gas and lateral skin staples are compatible with immediate postoperative state.  IMPRESSION: Status post right total hip replacement without evidence for complicating features.  Original Report Authenticated By: ERIC A. MANSELL, M.D.   Dg Hip Portable 1 View Right  08/20/2011  *RADIOLOGY REPORT*  Clinical Data: Postop right hip replacement.  PORTABLE RIGHT HIP - 1 VIEW  Comparison: Intraoperative images 08/20/2011  Findings: Cross-table lateral view demonstrates changes of right hip replacement.  Normal alignment on the cross-table lateral view. No complicating feature.  IMPRESSION: Right hip replacement.  No complicating feature on the cross-table lateral view.  Original Report Authenticated By: Cyndie Chime, M.D.   Dg C-arm 1-60 Min-no Report  08/20/2011  CLINICAL DATA: perioperative use   C-ARM 1-60 MINUTES  Fluoroscopy was utilized by the requesting physician.  No radiographic  interpretation.      Disposition: to home  Discharge Orders    Future Orders Please Complete By Expires   Diet - low sodium heart healthy      Call MD / Call 911      Comments:   If you experience chest pain or shortness of breath, CALL 911 and be  transported to the hospital emergency room.  If you develope a fever above 101 F, pus (white drainage) or increased drainage or redness at the wound, or calf pain, call your surgeon's office.   Constipation Prevention      Comments:   Drink plenty of fluids.  Prune juice may be helpful.  You may use a stool softener, such as Colace (over the counter) 100 mg twice a day.  Use MiraLax (over the counter) for constipation as needed.   Increase activity slowly as tolerated      Discharge instructions      Comments:   Increase your activities as comfort allows. Expect leg/foot swelling and knee pain. You can get your current dressing wet in the shower. You can get your actual incision wet starting 08/25/11, then dry dressing daily. Follow-up at [facility] in 2 weeks.   Discharge patient            Signed: Kathryne Hitch 08/23/2011, 6:59 AM

## 2011-08-23 NOTE — Progress Notes (Signed)
CARE MANAGEMENT NOTE 08/23/2011  Patient:  [name], [name]   Account Number:  [id number]  Date Initiated:  08/22/2011  Documentation initiated by:  DAVIS,TYMEEKA  Subjective/Objective Assessment:   56 yo male admitted with ight total hip replacement and a steroid injection in his left hip. PTA pt lived home with spouse.     Action/Plan:   Home when stable   Anticipated DC Date:  08/23/2011   Anticipated DC Plan:  HOME W HOME HEALTH SERVICES  In-house referral  NA      DC Planning Services  CM consult      PAC Choice  DURABLE MEDICAL EQUIPMENT  HOME HEALTH   Choice offered to / List presented to:  C-1 Patient   DME arranged  3-N-1  [name]      DME agency  [facility]     HH arranged  HH-2 PT      [facility] agency  [facility]   Status of service:  Completed, signed off Medicare Important Message given?  NO (If response is "NO", the following Medicare IM given date fields will be blank) Date Medicare IM given:   Date Additional Medicare IM given:    Discharge Disposition:  HOME W HOME HEALTH SERVICES  Per UR Regulation:  Reviewed for med. necessity/level of care/duration of stay  IComments:  08/23/2011 Raynelle Bring BSN CCM [phone] Pt plans discharge today. DME has been delivered to [facility].[name] will provide [name] services with start date of tomorrow 08/24/2011.

## 2011-08-23 NOTE — Progress Notes (Signed)
Physical Therapy Treatment Patient Details Name: [name] MRN: [id number] DOB: Oct 02, 1955 Today's Date: 08/23/2011 Time: 4098-1191 PT Time Calculation (min): 25 min  PT Assessment / Plan / Recommendation Comments on Treatment Session  Pt continues doing very well.  Assisted pt with dressing, educated on proper technique and that he could purchase hip kit to assist.  Ready for D/c.     Follow Up Recommendations  Home health PT    Barriers to Discharge        Equipment Recommendations  None recommended by PT;3 in 1 bedside comode    Recommendations for Other Services    Frequency 7X/week   Plan Discharge plan remains appropriate    Precautions / Restrictions Precautions Precautions: None Restrictions Weight Bearing Restrictions: No Other Position/Activity Restrictions: WBAT   Pertinent Vitals/Pain 4/10    Mobility  Bed Mobility Bed Mobility: Supine to Sit Supine to Sit: 5: Supervision Details for Bed Mobility Assistance: Pt able to get RLE out of bed on his own and demos good UE technique.  Transfers Transfers: Sit to Stand;Stand to Sit Sit to Stand: 6: Modified independent (Device/Increase time);With upper extremity assist;From chair/3-in-1 Stand to Sit: 6: Modified independent (Device/Increase time);With upper extremity assist;To bed Ambulation/Gait Ambulation/Gait Assistance: 6: Modified independent (Device/Increase time) Ambulation Distance (Feet): 180 Feet Assistive device: Rolling walker Gait Pattern: Step-to pattern;Decreased stride length Stairs: Yes Stairs Assistance: 5: Supervision Stairs Assistance Details (indicate cue type and reason): Min cues for RW placement and hand placement.  Stair Management Technique: No rails;Step to pattern;Backwards;Forwards;With walker Number of Stairs: 2     Exercises Total Joint Exercises Ankle Circles/Pumps: AROM;Both;20 reps Quad Sets: AROM;Right;10 reps Short Arc Quad: AROM;Right;5 reps Heel Slides:  AROM;Strengthening;Right;10 reps Hip ABduction/ADduction: AROM;Strengthening;Right;10 reps   PT Diagnosis:    PT Problem List:   PT Treatment Interventions:     PT Goals Acute Rehab PT Goals PT Goal Formulation: With patient Time For Goal Achievement: 08/23/11 Potential to Achieve Goals: Good Pt will go Supine/Side to Sit: with supervision PT Goal: Supine/Side to Sit - Progress: Met Pt will go Sit to Stand: with modified independence PT Goal: Sit to Stand - Progress: Met Pt will Ambulate: >150 feet;with modified independence;with least restrictive assistive device PT Goal: Ambulate - Progress: Met Pt will Go Up / Down Stairs: 1-2 stairs;with supervision;with least restrictive assistive device PT Goal: Up/Down Stairs - Progress: Met  Visit Information  Last PT Received On: 08/23/11 Assistance Needed: +1    Subjective Data  Subjective: I'm sore today Patient Stated Goal: to get home   Cognition  Overall Cognitive Status: Appears within functional limits for tasks assessed/performed Arousal/Alertness: Awake/alert Orientation Level: Appears intact for tasks assessed Behavior During Session: [facility] for tasks performed    Balance     End of Session PT - End of Session Activity Tolerance: Patient limited by pain Patient left: in chair;with call bell/phone within reach Nurse Communication: Mobility status   GP     Page, [name] 08/23/2011, 9:02 AM

## 2011-11-01 ENCOUNTER — Other Ambulatory Visit (HOSPITAL_COMMUNITY): Payer: Self-pay | Admitting: Orthopaedic Surgery

## 2011-12-03 ENCOUNTER — Encounter (HOSPITAL_COMMUNITY): Payer: Self-pay | Admitting: Pharmacy Technician

## 2011-12-07 NOTE — Patient Instructions (Signed)
20 Kamran Coker  12/07/2011   Your procedure is scheduled on:  12/17/11 0950am-1140am  Report to [facility] at 0730 AM.  Call this number if you have problems the morning of surgery: [phone]   Remember:   Do not eat food:After Midnight.  May have clear liquids:until Midnight .    Take these medicines the morning of surgery with A SIP OF WATER:    Do not wear jewelry,   Do not wear lotions, powders, or perfumes.   . Men may shave face and neck.  Do not bring valuables to the hospital.  Contacts, dentures or bridgework may not be worn into surgery.  Leave suitcase in the car. After surgery it may be brought to your room.  For patients admitted to the hospital, checkout time is 11:00 AM the day of discharge.                SEE CHG INSTRUCTION SHEET    Please read over the following fact sheets that you were given: MRSA Information, coughing and deep breathing exercises, leg exercises, Blood Transfusion Fact Sheet

## 2011-12-08 ENCOUNTER — Encounter (HOSPITAL_COMMUNITY)
Admission: RE | Admit: 2011-12-08 | Discharge: 2011-12-08 | Disposition: A | Discharge status: Home / Self Care | Payer: BC Managed Care – PPO | Source: Ambulatory Visit | Attending: Orthopaedic Surgery | Admitting: Orthopaedic Surgery

## 2011-12-08 ENCOUNTER — Encounter (HOSPITAL_COMMUNITY): Payer: Self-pay

## 2011-12-08 LAB — URINALYSIS, ROUTINE W REFLEX MICROSCOPIC
Bilirubin Urine: NEGATIVE
Glucose, UA: NEGATIVE mg/dL
Hgb urine dipstick: NEGATIVE
Ketones, ur: NEGATIVE mg/dL
Protein, ur: NEGATIVE mg/dL
Urobilinogen, UA: 0.2 mg/dL (ref 0.0–1.0)

## 2011-12-08 LAB — CBC
HCT: 41.6 % (ref 39.0–52.0)
Hemoglobin: 14.1 g/dL (ref 13.0–17.0)
MCH: 28.4 pg (ref 26.0–34.0)
MCHC: 33.9 g/dL (ref 30.0–36.0)
MCV: 83.7 fL (ref 78.0–100.0)
RDW: 14.1 % (ref 11.5–15.5)

## 2011-12-08 LAB — BASIC METABOLIC PANEL
BUN: 13 mg/dL (ref 6–23)
CO2: 24 mEq/L (ref 19–32)
Chloride: 102 mEq/L (ref 96–112)
Creatinine, Ser: 0.84 mg/dL (ref 0.50–1.35)
GFR calc Af Amer: >90 mL/min (ref >90)
Glucose, Bld: 99 mg/dL (ref 70–99)
Potassium: 3.7 mEq/L (ref 3.5–5.1)

## 2011-12-08 LAB — PROTIME-INR
INR: 0.94 (ref 0.00–1.49)
Prothrombin Time: 12.5 seconds (ref 11.6–15.2)

## 2011-12-08 NOTE — Progress Notes (Signed)
Called office and spoke with Judeth Cornfield, nurse of Dr Maureen Ralphs.  Made aware of abnormal lab results of white count of 2.3.  Also faxed to office via EPIC.

## 2011-12-17 ENCOUNTER — Ambulatory Visit (HOSPITAL_COMMUNITY): Payer: BC Managed Care – PPO

## 2011-12-17 ENCOUNTER — Encounter (HOSPITAL_COMMUNITY): Payer: Self-pay | Admitting: Anesthesiology

## 2011-12-17 ENCOUNTER — Encounter (HOSPITAL_COMMUNITY): Payer: Self-pay

## 2011-12-17 ENCOUNTER — Encounter (HOSPITAL_COMMUNITY)
Admission: RE | Disposition: A | Discharge status: Home / Self Care | Payer: Self-pay | Source: Ambulatory Visit | Attending: Orthopaedic Surgery

## 2011-12-17 ENCOUNTER — Inpatient Hospital Stay (HOSPITAL_COMMUNITY)
Admission: RE | Admit: 2011-12-17 | Discharge: 2011-12-19 | DRG: 818 | Disposition: A | Discharge status: Home / Self Care | Payer: BC Managed Care – PPO | Source: Ambulatory Visit | Attending: Orthopaedic Surgery | Admitting: Orthopaedic Surgery

## 2011-12-17 ENCOUNTER — Ambulatory Visit (HOSPITAL_COMMUNITY): Payer: BC Managed Care – PPO | Admitting: Anesthesiology

## 2011-12-17 DIAGNOSIS — Z79899 Other long term (current) drug therapy: Secondary | ICD-10-CM

## 2011-12-17 DIAGNOSIS — E785 Hyperlipidemia, unspecified: Secondary | ICD-10-CM | POA: Diagnosis present

## 2011-12-17 DIAGNOSIS — Z8546 Personal history of malignant neoplasm of prostate: Secondary | ICD-10-CM

## 2011-12-17 DIAGNOSIS — Z96649 Presence of unspecified artificial hip joint: Secondary | ICD-10-CM

## 2011-12-17 DIAGNOSIS — I10 Essential (primary) hypertension: Secondary | ICD-10-CM | POA: Diagnosis present

## 2011-12-17 DIAGNOSIS — M1610 Unilateral primary osteoarthritis, unspecified hip: Principal | ICD-10-CM | POA: Diagnosis present

## 2011-12-17 DIAGNOSIS — G4730 Sleep apnea, unspecified: Secondary | ICD-10-CM | POA: Diagnosis present

## 2011-12-17 DIAGNOSIS — M169 Osteoarthritis of hip, unspecified: Secondary | ICD-10-CM | POA: Diagnosis present

## 2011-12-17 HISTORY — PX: TOTAL HIP ARTHROPLASTY: SHX124

## 2011-12-17 LAB — TYPE AND SCREEN
ABO/RH(D): O NEG
Antibody Screen: NEGATIVE

## 2011-12-17 SURGERY — ARTHROPLASTY, HIP, TOTAL, ANTERIOR APPROACH
Anesthesia: General | Site: Hip | Laterality: Left | Wound class: Clean

## 2011-12-17 MED ORDER — MEPERIDINE HCL 50 MG/ML IJ SOLN: 6.2500 mg | INTRAMUSCULAR | Status: DC | PRN

## 2011-12-17 MED ORDER — METHOCARBAMOL 100 MG/ML IJ SOLN
500.0000 mg | Freq: Four times a day (QID) | INTRAVENOUS | Status: DC | PRN
  Administered 2011-12-17 (×2): 500 mg via INTRAVENOUS
  Filled 2011-12-17 (×2): qty 5

## 2011-12-17 MED ORDER — PROPOFOL 10 MG/ML IV BOLUS
INTRAVENOUS | Status: DC | PRN
  Administered 2011-12-17: 200 mg via INTRAVENOUS

## 2011-12-17 MED ORDER — FERROUS SULFATE 325 (65 FE) MG PO TABS
325.0000 mg | ORAL_TABLET | Freq: Three times a day (TID) | ORAL | Status: DC
  Administered 2011-12-17 – 2011-12-19 (×5): 325 mg via ORAL
  Filled 2011-12-17 (×9): qty 1

## 2011-12-17 MED ORDER — SODIUM CHLORIDE 0.9 % IV SOLN
INTRAVENOUS | Status: DC
  Administered 2011-12-17: 15:00:00 via INTRAVENOUS

## 2011-12-17 MED ORDER — VALSARTAN-HYDROCHLOROTHIAZIDE 160-25 MG PO TABS: 1.0000 | ORAL_TABLET | Freq: Every day | ORAL | Status: DC

## 2011-12-17 MED ORDER — DEXAMETHASONE SODIUM PHOSPHATE 10 MG/ML IJ SOLN
INTRAMUSCULAR | Status: DC | PRN
  Administered 2011-12-17: 10 mg via INTRAVENOUS

## 2011-12-17 MED ORDER — PROMETHAZINE HCL 25 MG/ML IJ SOLN: 6.2500 mg | INTRAMUSCULAR | Status: DC | PRN

## 2011-12-17 MED ORDER — OXYCODONE HCL ER 20 MG PO T12A
20.0000 mg | EXTENDED_RELEASE_TABLET | Freq: Two times a day (BID) | ORAL | Status: DC
  Administered 2011-12-17 – 2011-12-19 (×5): 20 mg via ORAL
  Filled 2011-12-17 (×5): qty 1

## 2011-12-17 MED ORDER — ROCURONIUM BROMIDE 100 MG/10ML IV SOLN
INTRAVENOUS | Status: DC | PRN
  Administered 2011-12-17: 15 mg via INTRAVENOUS
  Administered 2011-12-17: 65 mg via INTRAVENOUS

## 2011-12-17 MED ORDER — HYDROMORPHONE HCL PF 1 MG/ML IJ SOLN
0.2500 mg | INTRAMUSCULAR | Status: DC | PRN
  Administered 2011-12-17 (×2): 0.5 mg via INTRAVENOUS

## 2011-12-17 MED ORDER — HYDROMORPHONE HCL PF 1 MG/ML IJ SOLN: 1.0000 mg | INTRAMUSCULAR | Status: DC | PRN

## 2011-12-17 MED ORDER — OXYCODONE HCL 5 MG PO TABS
5.0000 mg | ORAL_TABLET | ORAL | Status: DC | PRN
  Administered 2011-12-18 – 2011-12-19 (×8): 10 mg via ORAL
  Filled 2011-12-17 (×8): qty 2

## 2011-12-17 MED ORDER — HYDROCHLOROTHIAZIDE 25 MG PO TABS
25.0000 mg | ORAL_TABLET | Freq: Every day | ORAL | Status: DC
  Administered 2011-12-18 – 2011-12-19 (×2): 25 mg via ORAL
  Filled 2011-12-17 (×3): qty 1

## 2011-12-17 MED ORDER — IRBESARTAN 150 MG PO TABS
150.0000 mg | ORAL_TABLET | Freq: Every day | ORAL | Status: DC
  Administered 2011-12-18 – 2011-12-19 (×2): 150 mg via ORAL
  Filled 2011-12-17 (×3): qty 1

## 2011-12-17 MED ORDER — ACETAMINOPHEN 10 MG/ML IV SOLN
INTRAVENOUS | Status: AC
  Filled 2011-12-17: qty 100

## 2011-12-17 MED ORDER — HYDROMORPHONE 0.3 MG/ML IV SOLN
INTRAVENOUS | Status: AC
  Filled 2011-12-17: qty 25

## 2011-12-17 MED ORDER — FENTANYL CITRATE 0.05 MG/ML IJ SOLN
INTRAMUSCULAR | Status: DC | PRN
  Administered 2011-12-17: 100 ug via INTRAVENOUS
  Administered 2011-12-17: 50 ug via INTRAVENOUS
  Administered 2011-12-17: 100 ug via INTRAVENOUS

## 2011-12-17 MED ORDER — DOCUSATE SODIUM 100 MG PO CAPS
100.0000 mg | ORAL_CAPSULE | Freq: Two times a day (BID) | ORAL | Status: DC
  Administered 2011-12-17 – 2011-12-19 (×5): 100 mg via ORAL

## 2011-12-17 MED ORDER — LACTATED RINGERS IV SOLN
INTRAVENOUS | Status: DC | PRN
  Administered 2011-12-17 (×3): via INTRAVENOUS

## 2011-12-17 MED ORDER — ASPIRIN EC 325 MG PO TBEC
325.0000 mg | DELAYED_RELEASE_TABLET | Freq: Two times a day (BID) | ORAL | Status: DC
  Administered 2011-12-18 – 2011-12-19 (×3): 325 mg via ORAL
  Filled 2011-12-17 (×5): qty 1

## 2011-12-17 MED ORDER — HYDROMORPHONE HCL PF 1 MG/ML IJ SOLN
INTRAMUSCULAR | Status: DC | PRN
  Administered 2011-12-17: 1 mg via INTRAVENOUS
  Administered 2011-12-17: 0.5 mg via INTRAVENOUS
  Administered 2011-12-17: .5 mg via INTRAVENOUS

## 2011-12-17 MED ORDER — GLYCOPYRROLATE 0.2 MG/ML IJ SOLN
INTRAMUSCULAR | Status: DC | PRN
  Administered 2011-12-17: .8 mg via INTRAVENOUS

## 2011-12-17 MED ORDER — HYDROMORPHONE HCL PF 1 MG/ML IJ SOLN
INTRAMUSCULAR | Status: AC
  Filled 2011-12-17: qty 1

## 2011-12-17 MED ORDER — METOCLOPRAMIDE HCL 5 MG/ML IJ SOLN
5.0000 mg | Freq: Three times a day (TID) | INTRAMUSCULAR | Status: DC | PRN
Start: 2011-12-17 — End: 2011-12-19

## 2011-12-17 MED ORDER — METHOCARBAMOL 500 MG PO TABS
500.0000 mg | ORAL_TABLET | Freq: Four times a day (QID) | ORAL | Status: DC | PRN
  Administered 2011-12-18 – 2011-12-19 (×3): 500 mg via ORAL
  Filled 2011-12-17 (×3): qty 1

## 2011-12-17 MED ORDER — OXYCODONE HCL 5 MG PO TABS: 5.0000 mg | ORAL_TABLET | Freq: Once | ORAL | Status: DC | PRN

## 2011-12-17 MED ORDER — PHENOL 1.4 % MT LIQD: 1.0000 | OROMUCOSAL | Status: DC | PRN

## 2011-12-17 MED ORDER — MIDAZOLAM HCL 5 MG/5ML IJ SOLN
INTRAMUSCULAR | Status: DC | PRN
  Administered 2011-12-17: 2 mg via INTRAVENOUS

## 2011-12-17 MED ORDER — 0.9 % SODIUM CHLORIDE (POUR BTL) OPTIME
TOPICAL | Status: DC | PRN
  Administered 2011-12-17: 1000 mL

## 2011-12-17 MED ORDER — ALUM & MAG HYDROXIDE-SIMETH 200-200-20 MG/5ML PO SUSP: 30.0000 mL | ORAL | Status: DC | PRN

## 2011-12-17 MED ORDER — CEFAZOLIN SODIUM-DEXTROSE 2-3 GM-% IV SOLR
2.0000 g | INTRAVENOUS | Status: AC
  Administered 2011-12-17: 2 g via INTRAVENOUS

## 2011-12-17 MED ORDER — ONDANSETRON HCL 4 MG/2ML IJ SOLN
INTRAMUSCULAR | Status: DC | PRN
  Administered 2011-12-17: 4 mg via INTRAVENOUS

## 2011-12-17 MED ORDER — CEFAZOLIN SODIUM-DEXTROSE 2-3 GM-% IV SOLR
2.0000 g | Freq: Four times a day (QID) | INTRAVENOUS | Status: AC
  Administered 2011-12-17 (×2): 2 g via INTRAVENOUS
  Filled 2011-12-17 (×2): qty 50

## 2011-12-17 MED ORDER — ACETAMINOPHEN 10 MG/ML IV SOLN: 1000.0000 mg | Freq: Once | INTRAVENOUS | Status: DC | PRN

## 2011-12-17 MED ORDER — MENTHOL 3 MG MT LOZG: 1.0000 | LOZENGE | OROMUCOSAL | Status: DC | PRN

## 2011-12-17 MED ORDER — METOCLOPRAMIDE HCL 10 MG PO TABS
5.0000 mg | ORAL_TABLET | Freq: Three times a day (TID) | ORAL | Status: DC | PRN
Start: 2011-12-17 — End: 2011-12-19

## 2011-12-17 MED ORDER — ACETAMINOPHEN 10 MG/ML IV SOLN
INTRAVENOUS | Status: DC | PRN
  Administered 2011-12-17: 1000 mg via INTRAVENOUS

## 2011-12-17 MED ORDER — ZOLPIDEM TARTRATE 5 MG PO TABS: 5.0000 mg | ORAL_TABLET | Freq: Every evening | ORAL | Status: DC | PRN

## 2011-12-17 MED ORDER — ONDANSETRON HCL 4 MG/2ML IJ SOLN: 4.0000 mg | Freq: Four times a day (QID) | INTRAMUSCULAR | Status: DC | PRN

## 2011-12-17 MED ORDER — ONDANSETRON HCL 4 MG PO TABS: 4.0000 mg | ORAL_TABLET | Freq: Four times a day (QID) | ORAL | Status: DC | PRN

## 2011-12-17 MED ORDER — CEFAZOLIN SODIUM-DEXTROSE 2-3 GM-% IV SOLR
INTRAVENOUS | Status: AC
  Filled 2011-12-17: qty 50

## 2011-12-17 MED ORDER — ACETAMINOPHEN 650 MG RE SUPP: 650.0000 mg | Freq: Four times a day (QID) | RECTAL | Status: DC | PRN

## 2011-12-17 MED ORDER — HYDROMORPHONE 0.3 MG/ML IV SOLN
INTRAVENOUS | Status: DC
  Administered 2011-12-17: 1.8 mg via INTRAVENOUS
  Administered 2011-12-17: 0.3 mg via INTRAVENOUS
  Administered 2011-12-17: 3 mg via INTRAVENOUS
  Administered 2011-12-17: 2.1 mg via INTRAVENOUS
  Administered 2011-12-17: 0.9 mg via INTRAVENOUS
  Administered 2011-12-18 (×2): 1.5 mg via INTRAVENOUS
  Filled 2011-12-17: qty 25

## 2011-12-17 MED ORDER — DIPHENHYDRAMINE HCL 12.5 MG/5ML PO ELIX: 12.5000 mg | ORAL_SOLUTION | ORAL | Status: DC | PRN

## 2011-12-17 MED ORDER — OXYCODONE HCL 5 MG/5ML PO SOLN
5.0000 mg | Freq: Once | ORAL | Status: DC | PRN
  Filled 2011-12-17: qty 5

## 2011-12-17 MED ORDER — ACETAMINOPHEN 325 MG PO TABS: 650.0000 mg | ORAL_TABLET | Freq: Four times a day (QID) | ORAL | Status: DC | PRN

## 2011-12-17 MED ORDER — NEOSTIGMINE METHYLSULFATE 1 MG/ML IJ SOLN
INTRAMUSCULAR | Status: DC | PRN
  Administered 2011-12-17: 5 mg via INTRAVENOUS

## 2011-12-17 MED ORDER — KETOROLAC TROMETHAMINE 15 MG/ML IJ SOLN
15.0000 mg | Freq: Four times a day (QID) | INTRAMUSCULAR | Status: AC
  Administered 2011-12-17 – 2011-12-18 (×4): 15 mg via INTRAVENOUS
  Filled 2011-12-17 (×4): qty 1

## 2011-12-17 SURGICAL SUPPLY — 33 items
BAG ZIPLOCK 12X15 (MISCELLANEOUS) ×4 IMPLANT
BLADE SAW SGTL 18X1.27X75 (BLADE) ×2 IMPLANT
CLOTH BEACON ORANGE TIMEOUT ST (SAFETY) ×2 IMPLANT
DRAPE C-ARM 42X72 X-RAY (DRAPES) ×2 IMPLANT
DRAPE STERI IOBAN 125X83 (DRAPES) ×2 IMPLANT
DRAPE U-SHAPE 47X51 STRL (DRAPES) ×6 IMPLANT
DRSG MEPILEX BORDER 4X8 (GAUZE/BANDAGES/DRESSINGS) ×2 IMPLANT
DURAPREP 26ML APPLICATOR (WOUND CARE) ×2 IMPLANT
ELECT BLADE TIP CTD 4 INCH (ELECTRODE) ×2 IMPLANT
ELECT REM PT RETURN 9FT ADLT (ELECTROSURGICAL) ×2
ELECTRODE REM PT RTRN 9FT ADLT (ELECTROSURGICAL) ×1 IMPLANT
FACESHIELD LNG OPTICON STERILE (SAFETY) ×8 IMPLANT
GAUZE XEROFORM 1X8 LF (GAUZE/BANDAGES/DRESSINGS) ×2 IMPLANT
GLOVE BIO SURGEON STRL SZ7 (GLOVE) ×2 IMPLANT
GLOVE BIO SURGEON STRL SZ7.5 (GLOVE) ×2 IMPLANT
GLOVE BIOGEL PI IND STRL 7.5 (GLOVE) IMPLANT
GLOVE BIOGEL PI IND STRL 8 (GLOVE) ×1 IMPLANT
GLOVE BIOGEL PI INDICATOR 7.5 (GLOVE)
GLOVE BIOGEL PI INDICATOR 8 (GLOVE) ×1
GLOVE ECLIPSE 7.0 STRL STRAW (GLOVE) ×2 IMPLANT
GOWN STRL REIN XL XLG (GOWN DISPOSABLE) ×4 IMPLANT
KIT BASIN OR (CUSTOM PROCEDURE TRAY) ×2 IMPLANT
PACK TOTAL JOINT (CUSTOM PROCEDURE TRAY) ×2 IMPLANT
PADDING CAST COTTON 6X4 STRL (CAST SUPPLIES) ×2 IMPLANT
PENCIL BUTTON HOLSTER BLD 10FT (ELECTRODE) ×2 IMPLANT
STAPLER VISISTAT 35W (STAPLE) IMPLANT
SUT ETHIBOND NAB CT1 #1 30IN (SUTURE) ×4 IMPLANT
SUT VIC AB 1 CT1 36 (SUTURE) ×4 IMPLANT
SUT VIC AB 2-0 CT1 27 (SUTURE) ×2
SUT VIC AB 2-0 CT1 TAPERPNT 27 (SUTURE) ×2 IMPLANT
TOWEL OR 17X26 10 PK STRL BLUE (TOWEL DISPOSABLE) ×4 IMPLANT
TOWEL OR NON WOVEN STRL DISP B (DISPOSABLE) ×2 IMPLANT
TRAY FOLEY CATH 14FRSI W/METER (CATHETERS) ×2 IMPLANT

## 2011-12-17 NOTE — Progress Notes (Signed)
RT Note: Placed pt on CPAP 10cmH20 with pt own mask and 2L 02 bleed in. Pt tolerating well, RT and RN will continue to monitor.

## 2011-12-17 NOTE — Evaluation (Signed)
Physical Therapy Evaluation Patient Details Name: [name] MRN: [id number] DOB: 1955-01-30 Today's Date: 12/17/2011 Time: 1634-     PT Assessment / Plan / Recommendation Clinical Impression  pt is s/p LEFT THA, POD# 0, today and will benefit from PT to maximize independence for home    PT Assessment  Patient needs continued PT services    Follow Up Recommendations  Home health PT;No PT follow up    Does the patient have the potential to tolerate intense rehabilitation      Barriers to Discharge        Equipment Recommendations  None recommended by PT    Recommendations for Other Services     Frequency 7X/week    Precautions / Restrictions Precautions Precautions: None Restrictions LLE Weight Bearing: Weight bearing as tolerated   Pertinent Vitals/Pain VSS; mild dizziness/constant/likely due to PCA      Mobility  Bed Mobility Bed Mobility: Supine to Sit Supine to Sit: 5: Supervision Details for Bed Mobility Assistance: for lines and safety Transfers Transfers: Sit to Stand;Stand to Sit Sit to Stand: 4: Min guard;From elevated surface;From bed Stand to Sit: 4: Min assist;4: Min guard;To chair/3-in-1 Details for Transfer Assistance: cues for hand placement and min/guard to control descent Ambulation/Gait Ambulation/Gait Assistance: 4: Min guard;5: Supervision Ambulation Distance (Feet): 75 Feet Assistive device: Rolling walker Ambulation/Gait Assistance Details: cues for sequence, pain control Gait Pattern: Step-to pattern;Antalgic Gait velocity: decreased    Shoulder Instructions     Exercises Total Joint Exercises Ankle Circles/Pumps: AROM;Both;10 reps Quad Sets: AROM;10 reps;Both   PT Diagnosis:    PT Problem List: Decreased strength;Decreased range of motion;Decreased activity tolerance;Decreased balance;Decreased mobility;Decreased knowledge of use of DME PT Treatment Interventions: DME instruction;Gait training;Stair training;Functional mobility  training;Therapeutic activities;Therapeutic exercise;Patient/family education   PT Goals Acute Rehab PT Goals PT Goal Formulation: With patient Time For Goal Achievement: 12/20/11 Potential to Achieve Goals: Good Pt will go Supine/Side to Sit: with modified independence PT Goal: Supine/Side to Sit - Progress: Goal set today Pt will go Sit to Supine/Side: with modified independence PT Goal: Sit to Supine/Side - Progress: Goal set today Pt will go Stand to Sit: with modified independence PT Goal: Stand to Sit - Progress: Goal set today Pt will Ambulate: 51 - 150 feet;with modified independence;with least restrictive assistive device PT Goal: Ambulate - Progress: Goal set today Pt will Go Up / Down Stairs: 1-2 stairs;with supervision;with least restrictive assistive device PT Goal: Up/Down Stairs - Progress: Goal set today Pt will Perform Home Exercise Program: with supervision, verbal cues required/provided PT Goal: Perform Home Exercise Program - Progress: Goal set today  Visit Information  Last PT Received On: 12/17/11 Assistance Needed: +1    Subjective Data  Subjective: I am pretty good Patient Stated Goal: home   Prior Functioning  Home Living Lives With: Spouse Available Help at Discharge: Family Type of Home: House Home Access: Stairs to enter [employer] of Steps: 1 or 2 Home Layout: One level Home Adaptive Equipment: Walker - rolling;Bedside commode/3-in-1;Raised toilet seat with rails Prior Function Level of Independence: Independent Able to Take Stairs?: Yes Driving: Yes Communication Communication: No difficulties    Cognition  Overall Cognitive Status: Appears within functional limits for tasks assessed/performed Arousal/Alertness: Awake/alert Orientation Level: Appears intact for tasks assessed Behavior During Session: [facility] for tasks performed    Extremity/Trunk Assessment Right Upper Extremity Assessment RUE ROM/Strength/Tone: [facility] for tasks  assessed Left Upper Extremity Assessment LUE ROM/Strength/Tone: [facility] for tasks assessed Right Lower Extremity Assessment RLE  ROM/Strength/Tone: [facility] for tasks assessed Left Lower Extremity Assessment LLE ROM/Strength/Tone: Deficits;Due to pain   Balance    End of Session PT - End of Session Equipment Utilized During Treatment: Gait belt Activity Tolerance: Patient tolerated treatment well Patient left: in chair;with call bell/phone within reach;with family/visitor present  GP     [facility] 12/17/2011, 5:06 PM

## 2011-12-17 NOTE — Anesthesia Preprocedure Evaluation (Addendum)
Anesthesia Evaluation  Patient identified by MRN, date of birth, ID band Patient awake    Reviewed: Allergy & Precautions, H&P , NPO status , Patient's Chart, lab work & pertinent test results  Airway Mallampati: II TM Distance: >3 FB Neck ROM: Full    Dental No notable dental hx. (+) Dental Advisory Given   Pulmonary neg pulmonary ROS, sleep apnea and Continuous Positive Airway Pressure Ventilation ,  breath sounds clear to auscultation  Pulmonary exam normal       Cardiovascular hypertension, Pt. on medications negative cardio ROS  Rhythm:Regular Rate:Normal     Neuro/Psych negative neurological ROS  negative psych ROS   GI/Hepatic negative GI ROS, Neg liver ROS,   Endo/Other  negative endocrine ROS  Renal/GU negative Renal ROS  negative genitourinary   Musculoskeletal negative musculoskeletal ROS (+) Arthritis -, Osteoarthritis,    Abdominal (+) + obese,   Peds negative pediatric ROS (+)  Hematology negative hematology ROS (+)   Anesthesia Other Findings   Reproductive/Obstetrics negative OB ROS                          Anesthesia Physical Anesthesia Plan  ASA: II  Anesthesia Plan: General   Post-op Pain Management:    Induction: Intravenous  Airway Management Planned: Oral ETT  Additional Equipment:   Intra-op Plan:   Post-operative Plan: Extubation in OR  Informed Consent: I have reviewed the patients History and Physical, chart, labs and discussed the procedure including the risks, benefits and alternatives for the proposed anesthesia with the patient or authorized representative who has indicated his/her understanding and acceptance.   Dental advisory given  Plan Discussed with: CRNA  Anesthesia Plan Comments:         Anesthesia Quick Evaluation                                   Anesthesia Evaluation  Patient identified by MRN, date of birth, ID band Patient  awake    Reviewed: Allergy & Precautions, H&P , NPO status , Patient's Chart, lab work & pertinent test results  Airway Mallampati: II TM Distance: >3 FB Neck ROM: Full    Dental No notable dental hx.    Pulmonary neg pulmonary ROS, sleep apnea and Continuous Positive Airway Pressure Ventilation ,  breath sounds clear to auscultation  Pulmonary exam normal       Cardiovascular hypertension, Pt. on medications negative cardio ROS  Rhythm:Regular Rate:Normal     Neuro/Psych negative neurological ROS  negative psych ROS   GI/Hepatic negative GI ROS, Neg liver ROS,   Endo/Other  negative endocrine ROS  Renal/GU negative Renal ROS  negative genitourinary   Musculoskeletal negative musculoskeletal ROS (+)   Abdominal   Peds negative pediatric ROS (+)  Hematology negative hematology ROS (+)   Anesthesia Other Findings   Reproductive/Obstetrics negative OB ROS                           Anesthesia Physical Anesthesia Plan  ASA: II  Anesthesia Plan: Spinal   Post-op Pain Management:    Induction:   Airway Management Planned: Simple Face Mask  Additional Equipment:   Intra-op Plan:   Post-operative Plan:   Informed Consent: I have reviewed the patients History and Physical, chart, labs and discussed the procedure including the risks, benefits and alternatives for the proposed  anesthesia with the patient or authorized representative who has indicated his/her understanding and acceptance.   Dental advisory given  Plan Discussed with: CRNA  Anesthesia Plan Comments:         Anesthesia Quick Evaluation

## 2011-12-17 NOTE — Anesthesia Postprocedure Evaluation (Signed)
Anesthesia Post Note  Patient: [name]  Procedure(s) Performed: Procedure(s) (LRB): TOTAL HIP ARTHROPLASTY ANTERIOR APPROACH (Left)  Anesthesia type: General  Patient location: PACU  Post pain: Pain level controlled  Post assessment: Post-op Vital signs reviewed  Last Vitals: BP 141/77  Pulse 75  Temp 36.5 C (Oral)  Resp 17  SpO2 100%  Post vital signs: Reviewed  Level of consciousness: sedated  Complications: No apparent anesthesia complications

## 2011-12-17 NOTE — Brief Op Note (Signed)
12/17/2011  9:46 AM  PATIENT:  [name]  56 y.o. male  PRE-OPERATIVE DIAGNOSIS:  Severe osteoarthritis left hip  POST-OPERATIVE DIAGNOSIS:  Severe osteoarthritis left hip  PROCEDURE:  Procedure(s) (LRB) with comments: TOTAL HIP ARTHROPLASTY ANTERIOR APPROACH (Left) - Left Total Hip Arthroplasty, Anterior Approach  SURGEON:  Surgeon(s) and Role:    * Kathryne Hitch, MD - Primary  PHYSICIAN ASSISTANT:   ASSISTANTS: none   ANESTHESIA:   general  EBL:  Total I/O In: 2900 [I.V.:2900] Out: 1400 [Urine:300; Blood:1100]  BLOOD ADMINISTERED:none  DRAINS: none   LOCAL MEDICATIONS USED:  NONE  SPECIMEN:  No Specimen and Simple Mastectomy  DISPOSITION OF SPECIMEN:  N/A  COUNTS:  YES  TOURNIQUET:  * No tourniquets in log *  DICTATION: .Other Dictation: Dictation Number [id number]  PLAN OF CARE: Admit to inpatient   PATIENT DISPOSITION:  PACU - hemodynamically stable.   Delay start of Pharmacological VTE agent (>24hrs) due to surgical blood loss or risk of bleeding: no

## 2011-12-17 NOTE — H&P (Signed)
TOTAL HIP ADMISSION H&P  Patient is admitted for left total hip arthroplasty.  Subjective:  Chief Complaint: left hip pain  HPI: [name], 56 y.o. male, has a history of pain and functional disability in the left hip(s) due to arthritis and patient has failed non-surgical conservative treatments for greater than 12 weeks to include NSAID's and/or analgesics, corticosteriod injections, weight reduction as appropriate and activity modification.  Onset of symptoms was gradual starting 5 years ago with gradually worsening course since that time.The patient noted no past surgery on the left hip(s).  Patient currently rates pain in the left hip at 8 out of 10 with activity. Patient has night pain, worsening of pain with activity and weight bearing, pain that interfers with activities of daily living and pain with passive range of motion. Patient has evidence of subchondral cysts, subchondral sclerosis, periarticular osteophytes and joint space narrowing by imaging studies. This condition presents safety issues increasing the risk of falls.  There is no current active infection.  Patient Active Problem List   Diagnosis Date Noted  . Degenerative arthritis of hip 08/20/2011   Past Medical History  Diagnosis Date  . Prostate cancer   . Hypertension   . Hypercholesteremia   . Arthritis   . Sleep apnea     USES C-PAP settings at 2     Past Surgical History  Procedure Date  . Prostate biopsy   . Prostatectomy 2012  . Achilles tendon repair 2009  . Knee arthrotomy 1976    LEFT  . Total hip arthroplasty 08/20/2011    Procedure: TOTAL HIP ARTHROPLASTY ANTERIOR APPROACH;  Surgeon: Kathryne Hitch, MD;  Location: [facility];  Service: Orthopedics;  Laterality: Right;  Right Total Hip Replacement, Anterior Approach  . Steriod injection 08/20/2011    Procedure: STEROID INJECTION;  Surgeon: Kathryne Hitch, MD;  Location: [facility];  Service: Orthopedics;  Laterality: Left;  Left Hip Steroid  Injection    Prescriptions prior to admission  Medication Sig Dispense Refill  . Cholecalciferol (VITAMIN D) 2000 UNITS CAPS Take 1 capsule by mouth daily.       . Cyanocobalamin (VITAMIN B-12 IJ) Inject 1,000 mg as directed every 30 (thirty) days.      . fish oil-omega-3 fatty acids 1000 MG capsule Take 1 g by mouth daily.       Marland Kitchen glucosamine-chondroitin 500-400 MG tablet Take 1 tablet by mouth 2 (two) times daily.      . Multiple Vitamins-Minerals (MULTIVITAMINS THER. W/MINERALS) TABS Take 1 tablet by mouth daily.       Marland Kitchen oxyCODONE-acetaminophen (PERCOCET/ROXICET) 5-325 MG per tablet Take 1-2 tablets by mouth every 4 (four) hours as needed. Pain      . traMADol (ULTRAM) 50 MG tablet Take 100 mg by mouth every 6 (six) hours as needed. Pain      . valsartan-hydrochlorothiazide (DIOVAN-HCT) 160-25 MG per tablet Take 1 tablet by mouth daily with breakfast.       . Probiotic Product (PHILLIPS COLON HEALTH PO) Take 1 capsule by mouth daily as needed. constipation       Allergies  Allergen Reactions  . Lipitor (Atorvastatin) Cough    History  Substance Use Topics  . Smoking status: Never Smoker   . Smokeless tobacco: Never Used  . Alcohol Use: Yes     Comment: socially    History reviewed. No pertinent family history.   Review of Systems  Musculoskeletal: Positive for joint pain.  All other systems reviewed and are negative.  Objective:  Physical Exam  Constitutional: He is oriented to person, place, and time. He appears well-developed and well-nourished.  HENT:  Head: Normocephalic and atraumatic.  Eyes: EOM are normal. Pupils are equal, round, and reactive to light.  Neck: Normal range of motion. Neck supple.  Cardiovascular: Normal rate and regular rhythm.   Respiratory: Effort normal and breath sounds normal.  GI: Soft. Bowel sounds are normal.  Musculoskeletal:       Left hip: He exhibits decreased range of motion, decreased strength and bony tenderness.  Neurological:  He is alert and oriented to person, place, and time.  Skin: Skin is warm and dry.  Psychiatric: He has a normal mood and affect.    Vital signs in last 24 hours: Temp:  [97.7 F (36.5 C)] 97.7 F (36.5 C) (12/06 0521) Pulse Rate:  [72] 72  (12/06 0521) Resp:  [18] 18  (12/06 0521) BP: (140)/(82) 140/82 mmHg (12/06 0521) SpO2:  [98 %] 98 % (12/06 0521)  Labs:   Estimated Body mass index is 32.20 kg/(m^2) as calculated from the following:   Height as of 08/10/11: 6\' 0" (1.829 m).   Weight as of 08/10/11: 237 lb 7 oz(107.701 kg).   Imaging Review Plain radiographs demonstrate severe degenerative joint disease of the left hip(s). The bone quality appears to be excellent for age and reported activity level.  Assessment/Plan:  End stage arthritis, left hip(s)  The patient history, physical examination, clinical judgement of the provider and imaging studies are consistent with end stage degenerative joint disease of the left hip(s) and total hip arthroplasty is deemed medically necessary. The treatment options including medical management, injection therapy, arthroscopy and arthroplasty were discussed at length. The risks and benefits of total hip arthroplasty were presented and reviewed. The risks due to aseptic loosening, infection, stiffness, dislocation/subluxation,  thromboembolic complications and other imponderables were discussed.  The patient acknowledged the explanation, agreed to proceed with the plan and consent was signed. Patient is being admitted for inpatient treatment for surgery, pain control, PT, OT, prophylactic antibiotics, VTE prophylaxis, progressive ambulation and ADL's and discharge planning.The patient is planning to be discharged home with home health services

## 2011-12-17 NOTE — Transfer of Care (Signed)
Immediate Anesthesia Transfer of Care Note  Patient: [name]  Procedure(s) Performed: Procedure(s) (LRB) with comments: TOTAL HIP ARTHROPLASTY ANTERIOR APPROACH (Left) - Left Total Hip Arthroplasty, Anterior Approach  Patient Location: PACU  Anesthesia Type:General  Level of Consciousness: awake, sedated and patient cooperative  Airway & Oxygen Therapy: Patient Spontanous Breathing and Patient connected to face mask oxygen  Post-op Assessment: Report given to PACU RN and Post -op Vital signs reviewed and stable  Post vital signs: Reviewed and stable  Complications: No apparent anesthesia complications

## 2011-12-17 NOTE — Plan of Care (Signed)
Problem: Consults Goal: Diagnosis- Total Joint Replacement Left anterior hip     

## 2011-12-17 NOTE — Op Note (Signed)
[name], [name] NO.:  [id number]  MEDICAL RECORD NO.:  [id number]  LOCATION:  1615                         FACILITY:  [facility]  PHYSICIAN:  Vanita Panda. Magnus Ivan, M.D.DATE OF BIRTH:  1955/05/02  DATE OF PROCEDURE:  12/17/2011 DATE OF DISCHARGE:                              OPERATIVE REPORT   PREOPERATIVE DIAGNOSIS:  End-stage arthritis and degenerative joint disease of left hip.  POSTOPERATIVE DIAGNOSIS:  End-stage arthritis and degenerative joint disease of left hip.  PROCEDURE:  Left total hip arthroplasty through direct anterior approach.  IMPLANTS:  DePuy Sector Gription acetabular component, size 58, size 36+ 4 neutral polyethylene liner, size 14 Corail femoral component with standard offset (KA), size 36+ 8.5 ceramic hip ball.  SURGEON:  Vanita Panda. Magnus Ivan, M.D.  ANESTHESIA:  General.  ANTIBIOTICS:  2 g of IV Ancef.  BLOOD LOSS:  1100 mL.  COMPLICATION:  None.  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  Just this past August, he underwent a successful right total hip arthroplasty through direct anterior approach, now he wish to proceed with the left one.  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  He understands the risks and benefits of surgery and does wish to proceed.  PROCEDURE DESCRIPTION:  After informed consent was obtained and appropriate left hip was marked, he was brought to the operating room. General anesthesia was obtained while he was on the stretcher, and a Foley catheter was placed.  Traction boots were placed on both feet. Next, we placed supine on the Hana fracture table.  Perineal post was placed and both legs were placed in inline skeletal traction, but no traction applied.  I then brought in the Fluoroscopic Unit/C-arm  and assessed the hip center, so I get his leg lengths.  We then prepped the left hip with DuraPrep and sterile drapes.  A time-out was called and he identified the correct patient, correct left hip.  I then made an incision posterior and inferior to the anterior-superior iliac spine and carried this obliquely down the leg.  I dissected down to the tensor fascia lata and the tensor fascia was divided longitudinally.  I then proceeded with a direct anterior approach to the hip.  A Cobra retractor was placed around the lateral neck and one around the medial neck.  I cauterized the lateral femoral circumflex vessels and then divided the hip capsule and placed Cobra retractors within the hip capsule.  I then made my femoral neck cut just barely proximal to the lesser trochanter with an oscillating saw and completed this with an osteotome.  I then used a corkscrew guide to remove the femoral head in its entirety.  He has a very large femoral head.  There was a lot of debris in the acetabulum and I then cleaned the acetabulum debris and remnants of the labrum and released the transverse acetabular ligament.  I then placed a bent Hohmann medially and a Cobra retractor laterally.  I began reaming from size 44 reamer  and 2 mm increments all the way to size 58 with all reamers were placed under direct visualization, and the last reamer also placed under direct fluoroscopy, so I could assess my depth of reaming, my inclination, and anteversion.  Once this was established, I then chose a size 58 DePuy Gription Sector acetabular component and placed this in the acetabulum without difficulty.  I then placed the apex hole eliminator guide and the 36+ 4 neutral polyethylene liner.  Attention was then turned to the femur with the leg actually rotated to 90 degrees, extended and adducted.  I gained access to the femoral canal using a box cutting guide, osteotome, and rongeurs.  I placed a Mueller retractor  medially and a Hohmann retractor behind the greater trochanter and released the lateral capsule.  I then began broaching from the size 8 broach all the way to size 14, and the 14 offered the most stability. I then trialed several different necks from a varus offset neck to standard and several different ball sizes bring the leg up and over each time and reducing it in the acetabulum.  I was pleased with the 36+ 8.5 length due to my lower neck cut.  I then removed all trial instrumentation and placed the real Corail femoral component size 14 with standard offset and the real ceramic hip ball, which was a 36+ 8.5. We reduced this back into the acetabulum that was stable and his leg lengths were measured near equal under fluoroscopy.  I then irrigated the soft tissues with normal saline solution.  I closed the joint capsule with interrupted #1 Ethibond suture followed by a running 0 V- Loc suture in the tensor fascia lata, 2-0 Vicryl in the subcutaneous tissue, and staples on the skin.  Xeroform followed well-padded sterile dressing was applied, and he was taken off of the Hana table, awakened, extubated, and taken to the recovery room in stable condition.  All final counts were correct.  There were no complications noted.     Vanita Panda. Magnus Ivan, M.D.     CYB/MEDQ  D:  12/17/2011  T:  12/17/2011  Job:  [id number]

## 2011-12-18 LAB — CBC
MCV: 84.6 fL (ref 78.0–100.0)
Platelets: 173 10*3/uL (ref 150–400)
RDW: 14.7 % (ref 11.5–15.5)
WBC: 4.3 10*3/uL (ref 4.0–10.5)

## 2011-12-18 LAB — BASIC METABOLIC PANEL
Chloride: 99 mEq/L (ref 96–112)
Creatinine, Ser: 0.97 mg/dL (ref 0.50–1.35)
GFR calc Af Amer: >90 mL/min (ref >90)

## 2011-12-18 MED ORDER — OXYCODONE-ACETAMINOPHEN 5-325 MG PO TABS: 1.0000 | ORAL_TABLET | ORAL | Status: AC | PRN

## 2011-12-18 MED ORDER — POLYETHYLENE GLYCOL 3350 17 G PO PACK
17.0000 g | PACK | Freq: Every day | ORAL | Status: DC
  Administered 2011-12-18 – 2011-12-19 (×2): 17 g via ORAL

## 2011-12-18 MED ORDER — ASPIRIN 325 MG PO TBEC: 325.0000 mg | DELAYED_RELEASE_TABLET | Freq: Two times a day (BID) | ORAL | Status: AC

## 2011-12-18 MED ORDER — FERROUS SULFATE 325 (65 FE) MG PO TABS: 325.0000 mg | ORAL_TABLET | Freq: Three times a day (TID) | ORAL | Status: DC

## 2011-12-18 MED ORDER — METHOCARBAMOL 500 MG PO TABS: 500.0000 mg | ORAL_TABLET | Freq: Four times a day (QID) | ORAL | Status: DC | PRN

## 2011-12-18 NOTE — Progress Notes (Signed)
Physical Therapy Treatment Patient Details Name: [name] MRN: [id number] DOB: [birth year] Today's Date: 12/18/2011 Time: 4098-1191 PT Time Calculation (min): 29 min  PT Assessment / Plan / Recommendation Comments on Treatment Session  pt doing well, will do stairs in am if pt desires to practice/possible D/C     Follow Up Recommendations  Home health PT;No PT follow up     Does the patient have the potential to tolerate intense rehabilitation     Barriers to Discharge        Equipment Recommendations  None recommended by PT    Recommendations for Other Services    Frequency 7X/week   Plan Discharge plan remains appropriate;Frequency remains appropriate    Precautions / Restrictions Precautions Precautions: None Restrictions LLE Weight Bearing: Weight bearing as tolerated   Pertinent Vitals/Pain C/o soreness only     Mobility  Bed Mobility Bed Mobility: Supine to Sit Supine to Sit: 5: Supervision Details for Bed Mobility Assistance: for lines and safety Transfers Transfers: Sit to Stand;Stand to Sit Sit to Stand: 5: Supervision Stand to Sit: 5: Supervision Details for Transfer Assistance: cues for hand placement  Ambulation/Gait Ambulation/Gait Assistance: 4: Min guard;5: Supervision Ambulation Distance (Feet): 120 Feet Assistive device: Rolling walker Ambulation/Gait Assistance Details: cues for  sequence Gait Pattern: Step-to pattern;Antalgic    Exercises Total Joint Exercises Ankle Circles/Pumps: AROM;Both;10 reps Quad Sets: AROM;10 reps;Both Heel Slides: AAROM;AROM;Left;10 reps Hip ABduction/ADduction: AROM;AAROM;Left;10 reps   PT Diagnosis:    PT Problem List:   PT Treatment Interventions:     PT Goals Acute Rehab PT Goals Time For Goal Achievement: 12/20/11 Potential to Achieve Goals: Good Pt will go Supine/Side to Sit: with modified independence PT Goal: Supine/Side to Sit - Progress: Goal set today Pt will go Sit to Supine/Side: with  modified independence Pt will go Stand to Sit: with modified independence PT Goal: Stand to Sit - Progress: Progressing toward goal Pt will Ambulate: 51 - 150 feet;with modified independence;with least restrictive assistive device PT Goal: Ambulate - Progress: Progressing toward goal Pt will Perform Home Exercise Program: with supervision, verbal cues required/provided PT Goal: Perform Home Exercise Program - Progress: Progressing toward goal  Visit Information  Last PT Received On: 12/18/11 Assistance Needed: +1    Subjective Data  Subjective: alright Patient Stated Goal: home   Cognition  Overall Cognitive Status: Appears within functional limits for tasks assessed/performed Arousal/Alertness: Awake/alert Orientation Level: Appears intact for tasks assessed Behavior During Session: [facility] for tasks performed    Balance     End of Session PT - End of Session Equipment Utilized During Treatment: Gait belt Activity Tolerance: Patient tolerated treatment well Patient left: in chair;with call bell/phone within reach Nurse Communication: Mobility status;Patient requests pain meds   GP     [facility] 12/18/2011, 2:01 PM

## 2011-12-18 NOTE — Progress Notes (Signed)
Subjective: Pt stable - pain controlled  Objective: Vital signs in last 24 hours: Temp:  [97.5 F (36.4 C)-99.4 F (37.4 C)] 99.4 F (37.4 C) (12/07 1046) Pulse Rate:  [70-96] 78  (12/07 1046) Resp:  [12-16] 16  (12/07 1046) BP: (98-155)/(46-87) 104/46 mmHg (12/07 1046) SpO2:  [97 %-100 %] 98 % (12/07 1046) Weight:  [108.41 kg (239 lb)] 108.41 kg (239 lb) (12/06 1115)  Intake/Output from previous day: 12/06 0701 - 12/07 0700 In: 5997.5 [P.O.:1440; I.V.:4352.5; IV Piggyback:205] Out: 2435 [Urine:1335; Blood:1100] Intake/Output this shift: Total I/O In: 240 [P.O.:240] Out: -   Exam:  Neurovascular intact Sensation intact distally Intact pulses distally Dorsiflexion/Plantar flexion intact  Labs:  Basename 12/18/11 0632  HGB 9.8*    Basename 12/18/11 0632  WBC 4.3  RBC 3.44*  HCT 29.1*  PLT 173    Basename 12/18/11 0632  NA 133*  K 3.4*  CL 99  CO2 27  BUN 12  CREATININE 0.97  GLUCOSE 102*  CALCIUM 8.1*   No results found for this basename: LABPT:2,INR:2 in the last 72 hours  Assessment/Plan: Pt doing well - possible dc am   [name] 12/18/2011, 10:59 AM

## 2011-12-18 NOTE — Progress Notes (Signed)
OT Cancellation Note  Patient Details Name: [name] MRN: [id number] DOB: 1955-07-28   Cancelled Treatment:    Reason Eval/Treat Not Completed: Other (comment) (OT screen) Pt has all DME from previous hip surgery in August this year. Has assist PRN and reports not feeling like he needs acute OT.  Lennox Laity [phone] 12/18/2011, 12:08 PM

## 2011-12-18 NOTE — Progress Notes (Signed)
Pt stated that he did not need any help getting CPAP on.  Pt notified to call if had any problems.  RT to monitor and assess as needed.

## 2011-12-18 NOTE — Progress Notes (Signed)
Utilization review completed.  

## 2011-12-18 NOTE — Progress Notes (Signed)
12/18/11 1700  PT Visit Information  Last PT Received On 12/18/11  Assistance Needed +1  PT Time Calculation  PT Start Time 1725  PT Stop Time 1742  PT Time Calculation (min) 17 min  Subjective Data  Subjective let's go  Precautions  Precautions None  Restrictions  LLE Weight Bearing WBAT  Cognition  Overall Cognitive Status Appears within functional limits for tasks assessed/performed  Arousal/Alertness Awake/alert  Orientation Level Appears intact for tasks assessed  Behavior During Session [facility] for tasks performed  Bed Mobility  Bed Mobility Supine to Sit;Sit to Supine  Supine to Sit 5: Supervision  Sit to Supine 5: Supervision  Details for Bed Mobility Assistance pt self assistLLE  Transfers  Transfers Sit to Stand;Stand to Sit  Sit to Stand 5: Supervision;6: Modified independent (Device/Increase time)  Stand to Sit 5: Supervision  Details for Transfer Assistance subtle cues for hand placement  Ambulation/Gait  Ambulation/Gait Assistance 5: Supervision  Ambulation Distance (Feet) 125 Feet (15)  Assistive device Rolling walker  Ambulation/Gait Assistance Details cues for step through  Gait Pattern Step-through pattern  Total Joint Exercises  Hip ABduction/ADduction AROM;Left;10 reps;Standing  Marching in Standing AROM;Left;10 reps;Standing;Strengthening  Knee Flexion AROM;Strengthening;Left;10 reps;Standing  PT - End of Session  Activity Tolerance Patient tolerated treatment well  Patient left in bed;with call bell/phone within reach;with family/visitor present  PT - Assessment/Plan  Comments on Treatment Session doing very well will possible D/C Sun or Mon  PT Plan Discharge plan remains appropriate;Frequency remains appropriate  PT Frequency 7X/week  Follow Up Recommendations Home health PT;No PT follow up  Equipment Recommended None recommended by PT  Acute Rehab PT Goals  Time For Goal Achievement 12/20/11  Potential to Achieve Goals Good  Pt will go  Supine/Side to Sit with modified independence  PT Goal: Supine/Side to Sit - Progress Progressing toward goal  Pt will go Sit to Supine/Side with modified independence  PT Goal: Sit to Supine/Side - Progress Progressing toward goal  Pt will go Stand to Sit with modified independence  PT Goal: Stand to Sit - Progress Progressing toward goal  Pt will Ambulate 51 - 150 feet;with modified independence;with least restrictive assistive device  PT Goal: Ambulate - Progress Progressing toward goal  Pt will Perform Home Exercise Program with supervision, verbal cues required/provided  PT Goal: Perform Home Exercise Program - Progress Progressing toward goal  PT Treatments  $Gait Training 8-22 mins

## 2011-12-19 LAB — CBC
HCT: 27.3 % — ABNORMAL LOW (ref 39.0–52.0)
Hemoglobin: 9.4 g/dL — ABNORMAL LOW (ref 13.0–17.0)
WBC: 4.8 10*3/uL (ref 4.0–10.5)

## 2011-12-19 MED ORDER — BISACODYL 10 MG RE SUPP
10.0000 mg | Freq: Once | RECTAL | Status: AC
  Administered 2011-12-19: 10 mg via RECTAL
  Filled 2011-12-19: qty 1

## 2011-12-19 NOTE — Progress Notes (Signed)
Subjective: Pt stable - ready for dc   Objective: Vital signs in last 24 hours: Temp:  [99.7 F (37.6 C)-100.8 F (38.2 C)] 99.7 F (37.6 C) (12/08 0549) Pulse Rate:  [79-96] 84  (12/08 0549) Resp:  [16-17] 16  (12/08 0549) BP: (99-125)/(56-72) 99/62 mmHg (12/08 0549) SpO2:  [93 %-98 %] 94 % (12/08 0549)  Intake/Output from previous day: 12/07 0701 - 12/08 0700 In: 960 [P.O.:960] Out: 1301 [Urine:1300; Stool:1] Intake/Output this shift:    Exam:  Sensation intact distally Intact pulses distally Dorsiflexion/Plantar flexion intact  Labs:  Basename 12/19/11 0505 12/18/11 0632  HGB 9.4* 9.8*    Basename 12/19/11 0505 12/18/11 0632  WBC 4.8 4.3  RBC 3.25* 3.44*  HCT 27.3* 29.1*  PLT 172 173    Basename 12/18/11 0632  NA 133*  K 3.4*  CL 99  CO2 27  BUN 12  CREATININE 0.97  GLUCOSE 102*  CALCIUM 8.1*   No results found for this basename: LABPT:2,INR:2 in the last 72 hours  Assessment/Plan: Dc home   DEAN,GREGORY SCOTT 12/19/2011, 11:31 AM

## 2011-12-19 NOTE — Progress Notes (Signed)
Discharged to [facility] auto via w/c. Assessment unchanged from am

## 2011-12-19 NOTE — Progress Notes (Signed)
Physical Therapy Treatment Patient Details Name: [name] MRN: [id number] DOB: 13-Nov-1955 Today's Date: 12/19/2011 Time: 7829-5621 PT Time Calculation (min): 17 min  PT Assessment / Plan / Recommendation Comments on Treatment Session  pt doing very well; pt and wife feel comfortable  with D/C home    Follow Up Recommendations  Home health PT;No PT follow up     Does the patient have the potential to tolerate intense rehabilitation     Barriers to Discharge        Equipment Recommendations  None recommended by PT    Recommendations for Other Services    Frequency 7X/week   Plan Discharge plan remains appropriate;Frequency remains appropriate    Precautions / Restrictions Precautions Precautions: None Restrictions LLE Weight Bearing: Weight bearing as tolerated   Pertinent Vitals/Pain C/o soreness only left hip    Mobility  Bed Mobility Bed Mobility: Not assessed Transfers Transfers: Sit to Stand;Stand to Sit Sit to Stand: 6: Modified independent (Device/Increase time) Stand to Sit: 6: Modified independent (Device/Increase time) Ambulation/Gait Ambulation/Gait Assistance: 6: Modified independent (Device/Increase time) Ambulation Distance (Feet): 175 Feet Assistive device: Rolling walker Gait Pattern: Step-through pattern Stairs: Yes Stairs Assistance: 5: Supervision Stairs Assistance Details (indicate cue type and reason): cues for sequence Stair Management Technique: No rails;With walker;Backwards Number of Stairs: 1     Exercises     PT Diagnosis:    PT Problem List:   PT Treatment Interventions:     PT Goals Acute Rehab PT Goals PT Goal Formulation: With patient Time For Goal Achievement: 12/20/11 Potential to Achieve Goals: Good Pt will go Stand to Sit: with modified independence PT Goal: Stand to Sit - Progress: Met Pt will Ambulate: 51 - 150 feet;with modified independence;with least restrictive assistive device PT Goal: Ambulate - Progress:  Met Pt will Go Up / Down Stairs: 1-2 stairs;with supervision;with least restrictive assistive device PT Goal: Up/Down Stairs - Progress: Met  Visit Information  Last PT Received On: 12/19/11 Assistance Needed: +1    Subjective Data  Subjective: i am going home today Patient Stated Goal: home   Cognition  Overall Cognitive Status: Appears within functional limits for tasks assessed/performed Arousal/Alertness: Awake/alert Orientation Level: Appears intact for tasks assessed Behavior During Session: [facility] for tasks performed    Balance     End of Session PT - End of Session Equipment Utilized During Treatment: Gait belt Activity Tolerance: Patient tolerated treatment well Patient left: in chair;with call bell/phone within reach;with family/visitor present   GP     [facility] 12/19/2011, 9:21 AM

## 2011-12-19 NOTE — Care Management Note (Signed)
    Page 1 of 1   12/19/2011     8:59:32 AM   CARE MANAGEMENT NOTE 12/19/2011  Patient:  [name], [name]   Account Number:  [id number]  Date Initiated:  12/19/2011  Documentation initiated by:  Roseanne Reno  Subjective/Objective Assessment:   History of pain and functional disability in the left hip(s) due to arthritis and patient has failed non-surgical conservative treatments.     Action/Plan:   Left total hip arthroplasty.   Anticipated DC Date:  12/19/2011   Anticipated DC Plan:  HOME W HOME HEALTH SERVICES         [facility] Choice  HOME HEALTH   Choice offered to / List presented to:  C-1 Patient        HH arranged  HH-2 PT      [facility] agency  [facility]   Status of service:  Completed, signed off  Discharge Disposition:  HOME W HOME HEALTH SERVICES  Comments:  12/19/11  855a  Spoke w/ pt and wife about HHC and agenices. Pt stated that he was set up to use [facility] and would like to continue with them.  This is his second hip surgery and he used [facility] in the past.  Will notify [facility] of planned dc for today.  CM available to assist as needed. TJohnson, RNBSN  [phone]

## 2011-12-20 ENCOUNTER — Encounter (HOSPITAL_COMMUNITY): Payer: Self-pay | Admitting: Orthopaedic Surgery

## 2011-12-20 NOTE — Discharge Summary (Signed)
Patient ID: [name] MRN: [id number] DOB/AGE: 1955/07/31 56 y.o.  Admit date: 12/17/2011 Discharge date: 12/20/2011  Admission Diagnoses:  Active Problems:  Degenerative arthritis of hip   Discharge Diagnoses:  Same  Past Medical History  Diagnosis Date  . Prostate cancer   . Hypertension   . Hypercholesteremia   . Arthritis   . Sleep apnea     USES C-PAP settings at 2     Surgeries: Procedure(s): TOTAL HIP ARTHROPLASTY ANTERIOR APPROACH on 12/17/2011   Consultants:    Discharged Condition: Improved  Hospital Course: [name] is an 56 y.o. male who was admitted 12/17/2011 for operative treatment of<principal problem not specified>. Patient has severe unremitting pain that affects sleep, daily activities, and work/hobbies. After pre-op clearance the patient was taken to the operating room on 12/17/2011 and underwent  Procedure(s): TOTAL HIP ARTHROPLASTY ANTERIOR APPROACH.    Patient was given perioperative antibiotics: Anti-infectives     Start     Dose/Rate Route Frequency Ordered Stop   12/17/11 1400   ceFAZolin (ANCEF) IVPB 2 g/50 mL premix        2 g 100 mL/hr over 30 Minutes Intravenous Every 6 hours 12/17/11 1125 12/17/11 2037   12/17/11 0531   ceFAZolin (ANCEF) IVPB 2 g/50 mL premix        2 g 100 mL/hr over 30 Minutes Intravenous 60 min pre-op 12/17/11 0531 12/17/11 0750           Patient was given sequential compression devices, early ambulation, and chemoprophylaxis to prevent DVT.  Patient benefited maximally from hospital stay and there were no complications.    Recent vital signs: No data found.    Recent laboratory studies:  [facility] 12/19/11 0505 01/07/2012 0632  WBC 4.8 4.3  HGB 9.4* 9.8*  HCT 27.3* 29.1*  PLT 172 173  NA -- 133*  K -- 3.4*  CL -- 99  CO2 -- 27  BUN -- 12  CREATININE -- 0.97  GLUCOSE -- 102*  INR -- --  CALCIUM -- 8.1*     Discharge Medications:     Medication List     As of 12/20/2011  7:06 AM    TAKE  these medications         aspirin 325 MG EC tablet   Take 1 tablet (325 mg total) by mouth 2 (two) times daily.      ferrous sulfate 325 (65 FE) MG tablet   Take 1 tablet (325 mg total) by mouth 3 (three) times daily after meals.      fish oil-omega-3 fatty acids 1000 MG capsule   Take 1 g by mouth daily.      glucosamine-chondroitin 500-400 MG tablet   Take 1 tablet by mouth 2 (two) times daily.      methocarbamol 500 MG tablet   Commonly known as: ROBAXIN   Take 1 tablet (500 mg total) by mouth every 6 (six) hours as needed.      multivitamins ther. w/minerals Tabs   Take 1 tablet by mouth daily.      oxyCODONE-acetaminophen 5-325 MG per tablet   Commonly known as: PERCOCET/ROXICET   Take 1-2 tablets by mouth every 4 (four) hours as needed for pain.      oxyCODONE-acetaminophen 5-325 MG per tablet   Commonly known as: PERCOCET/ROXICET   Take 1-2 tablets by mouth every 4 (four) hours as needed. Pain      PHILLIPS COLON HEALTH PO   Take 1 capsule by mouth daily as  needed. constipation      traMADol 50 MG tablet   Commonly known as: ULTRAM   Take 100 mg by mouth every 6 (six) hours as needed. Pain      valsartan-hydrochlorothiazide 160-25 MG per tablet   Commonly known as: DIOVAN-HCT   Take 1 tablet by mouth daily with breakfast.      VITAMIN B-12 IJ   Inject 1,000 mg as directed every 30 (thirty) days.      Vitamin D 2000 UNITS Caps   Take 1 capsule by mouth daily.        Diagnostic Studies: Dg Hip Complete Left  12/17/2011  *RADIOLOGY REPORT*  Clinical Data: Left hip replacement.  LEFT HIP - COMPLETE 2+ VIEW  Comparison: None.  Findings: Two intraoperative C-arm views submitted for review after surgery.  Frontal projection with left hip prosthesis appearing in satisfactory position.  Heterotopic bone medial to the left lesser trochanter.  Remote right hip replacement.  Foley catheter in place.  IMPRESSION: Frontal projection with left hip prosthesis appearing in  satisfactory position.  Heterotopic bone medial to the left lesser trochanter.  .   Original Report Authenticated By: Lacy Duverney, M.D.    Dg Pelvis Portable  12/17/2011  *RADIOLOGY REPORT*  Clinical Data: Postoperative radiographs.  Total hip arthroplasty.  PORTABLE PELVIS  Comparison: Preoperative radiograph 08/20/2011.  Lateral view today.  Findings: There is a new left total hip arthroplasty with expected postoperative changes in the soft tissues.  There is a large chunk of heterotopic bone adjacent to the lesser trochanter measuring 3 cm x 6.5 cm.  Foley catheter with thermistor is present over the pelvis.  IMPRESSION: Uncomplicated new left total hip arthroplasty.  Large chunk of heterotopic bone inferior medial to the left lesser trochanter.   Original Report Authenticated By: Andreas Newport, M.D.    Dg Hip Portable 1 View Left  12/17/2011  *RADIOLOGY REPORT*  Clinical Data: Postoperative radiographs.  Left total hip arthroplasty.  PORTABLE LEFT HIP - 1 VIEW  Comparison: AP view today.  Findings: Left total hip arthroplasty is present with expected postoperative changes in the soft tissues.  Suboptimal exposure with underpenetration of the medial aspect of the hip.  No periprosthetic fracture around the femoral stem.  Acetabular cup is incompletely visualized.  The hip appears located in conjunction with the AP view.  IMPRESSION: Uncomplicated new left total hip arthroplasty.  Technically suboptimal film.   Original Report Authenticated By: Andreas Newport, M.D.    Dg C-arm 1-60 Min-no Report  12/17/2011  CLINICAL DATA: left anterior hip arthroplasty   C-ARM 1-60 MINUTES  Fluoroscopy was utilized by the requesting physician.  No radiographic  interpretation.      Disposition: 01-Home or Self Care      Discharge Orders    Future Orders Please Complete By Expires   Diet - low sodium heart healthy      Diet - low sodium heart healthy      Diet - low sodium heart healthy      Call MD / Call  911      Comments:   If you experience chest pain or shortness of breath, CALL 911 and be transported to the hospital emergency room.  If you develope a fever above 101 F, pus (white drainage) or increased drainage or redness at the wound, or calf pain, call your surgeon's office.   Constipation Prevention      Comments:   Drink plenty of fluids.  Prune juice may be helpful.  You may use a stool softener, such as Colace (over the counter) 100 mg twice a day.  Use MiraLax (over the counter) for constipation as needed.   Increase activity slowly as tolerated      Discharge instructions      Comments:   Expect left leg and thigh swelling. Increase your activities as comfort allows. Ice as needed. Wait until this coming Wed. 12/11 before getting your incision wet in the shower.   Call MD / Call 911      Comments:   If you experience chest pain or shortness of breath, CALL 911 and be transported to the hospital emergency room.  If you develope a fever above 101 F, pus (white drainage) or increased drainage or redness at the wound, or calf pain, call your surgeon's office.   Constipation Prevention      Comments:   Drink plenty of fluids.  Prune juice may be helpful.  You may use a stool softener, such as Colace (over the counter) 100 mg twice a day.  Use MiraLax (over the counter) for constipation as needed.   Increase activity slowly as tolerated      Call MD / Call 911      Comments:   If you experience chest pain or shortness of breath, CALL 911 and be transported to the hospital emergency room.  If you develope a fever above 101 F, pus (white drainage) or increased drainage or redness at the wound, or calf pain, call your surgeon's office.   Constipation Prevention      Comments:   Drink plenty of fluids.  Prune juice may be helpful.  You may use a stool softener, such as Colace (over the counter) 100 mg twice a day.  Use MiraLax (over the counter) for constipation as needed.   Increase  activity slowly as tolerated         Follow-up Information    Follow up with Kathryne Hitch, MD. In 2 weeks.   Contact information:   [address] [place] [address] [place] [address] [phone]           Signed: Kathryne Hitch 12/20/2011, 7:06 AM

## 2012-12-22 ENCOUNTER — Other Ambulatory Visit: Payer: Self-pay | Admitting: Family Medicine

## 2012-12-22 DIAGNOSIS — R748 Abnormal levels of other serum enzymes: Secondary | ICD-10-CM

## 2012-12-27 ENCOUNTER — Ambulatory Visit
Admission: RE | Admit: 2012-12-27 | Discharge: 2012-12-27 | Disposition: A | Discharge status: Home / Self Care | Payer: BC Managed Care – PPO | Source: Ambulatory Visit | Attending: Family Medicine | Admitting: Family Medicine

## 2012-12-27 DIAGNOSIS — R748 Abnormal levels of other serum enzymes: Secondary | ICD-10-CM

## 2015-03-10 ENCOUNTER — Other Ambulatory Visit: Payer: Self-pay | Admitting: Family Medicine

## 2015-03-10 ENCOUNTER — Ambulatory Visit
Admission: RE | Admit: 2015-03-10 | Discharge: 2015-03-10 | Disposition: A | Discharge status: Home / Self Care | Payer: BC Managed Care – PPO | Source: Ambulatory Visit | Attending: Family Medicine | Admitting: Family Medicine

## 2015-03-10 DIAGNOSIS — R52 Pain, unspecified: Secondary | ICD-10-CM

## 2016-05-19 IMAGING — CR DG ANKLE COMPLETE 3+V*L*
3 series · 3 of 3 positions shown · non-contrast
Comparison: None.

CLINICAL DATA: Lateral distal fibula pain for several days. No
known injury. Gout.

EXAM:
LEFT ANKLE COMPLETE - 3+ VIEW

[view not recorded (1 of 3)]
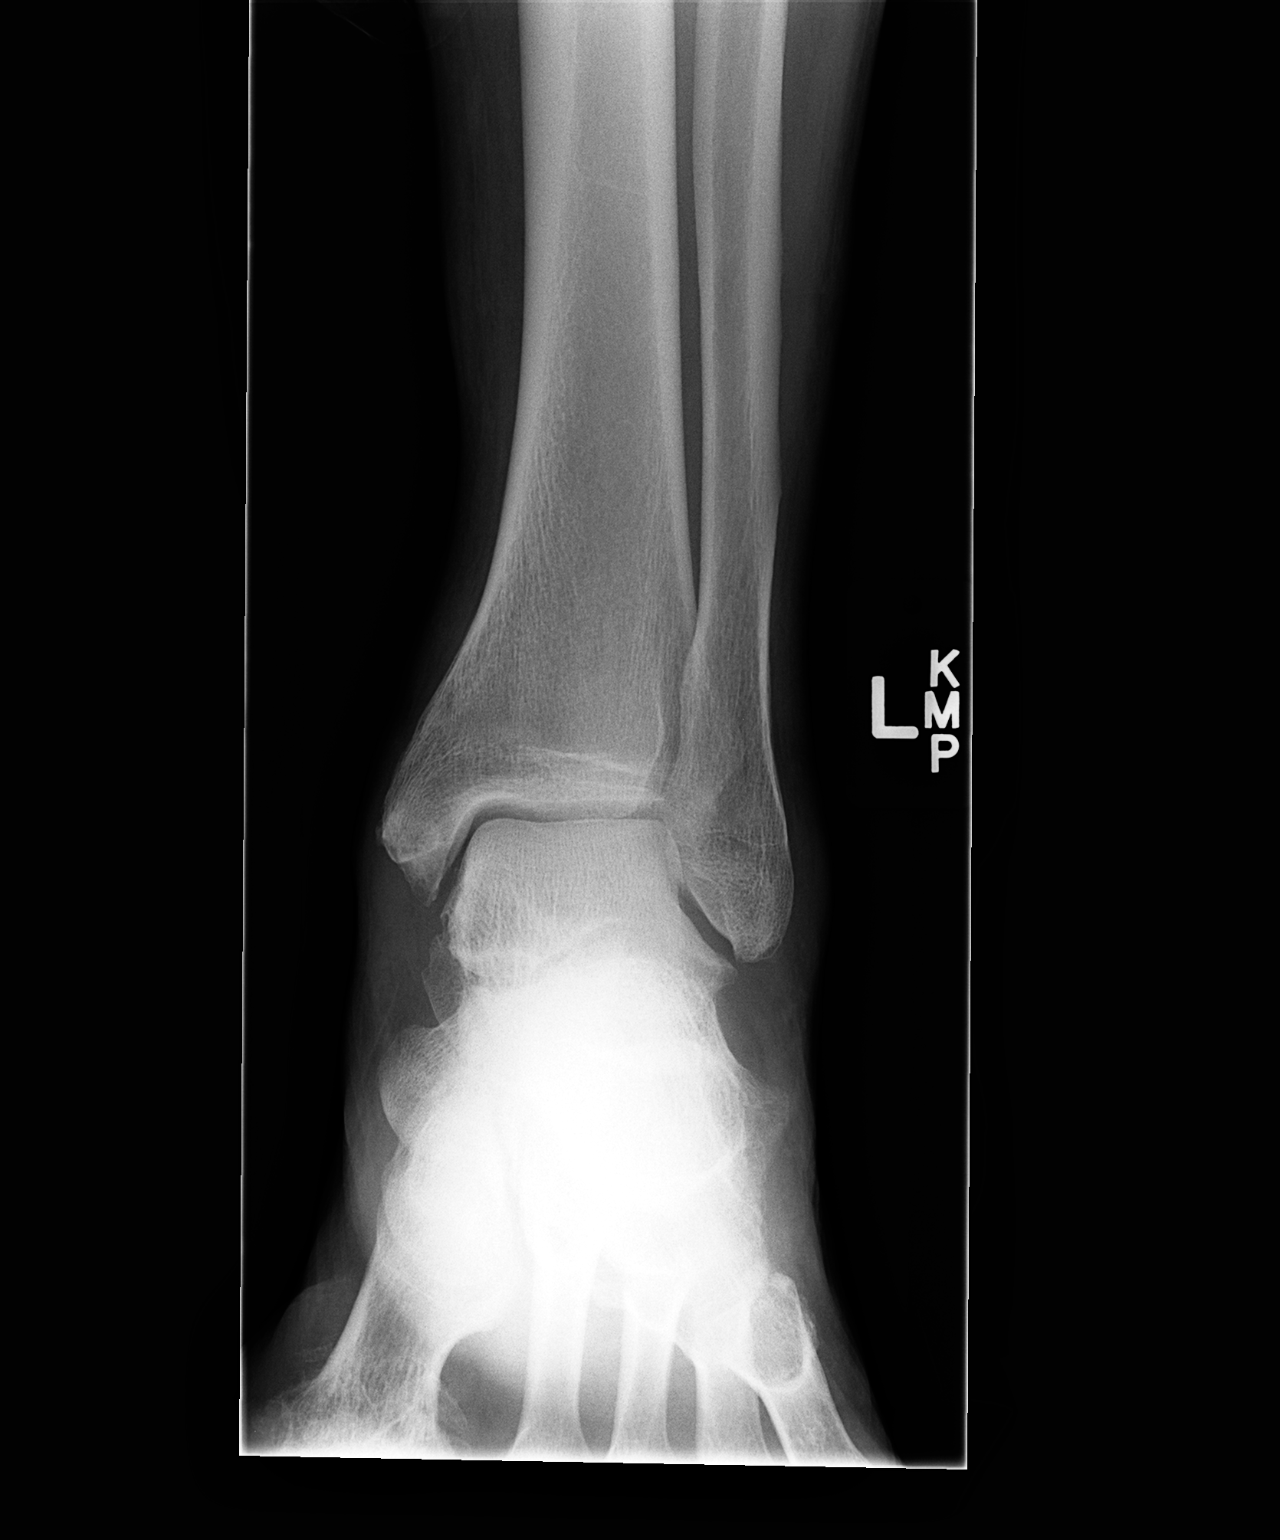

[view not recorded (2 of 3)]
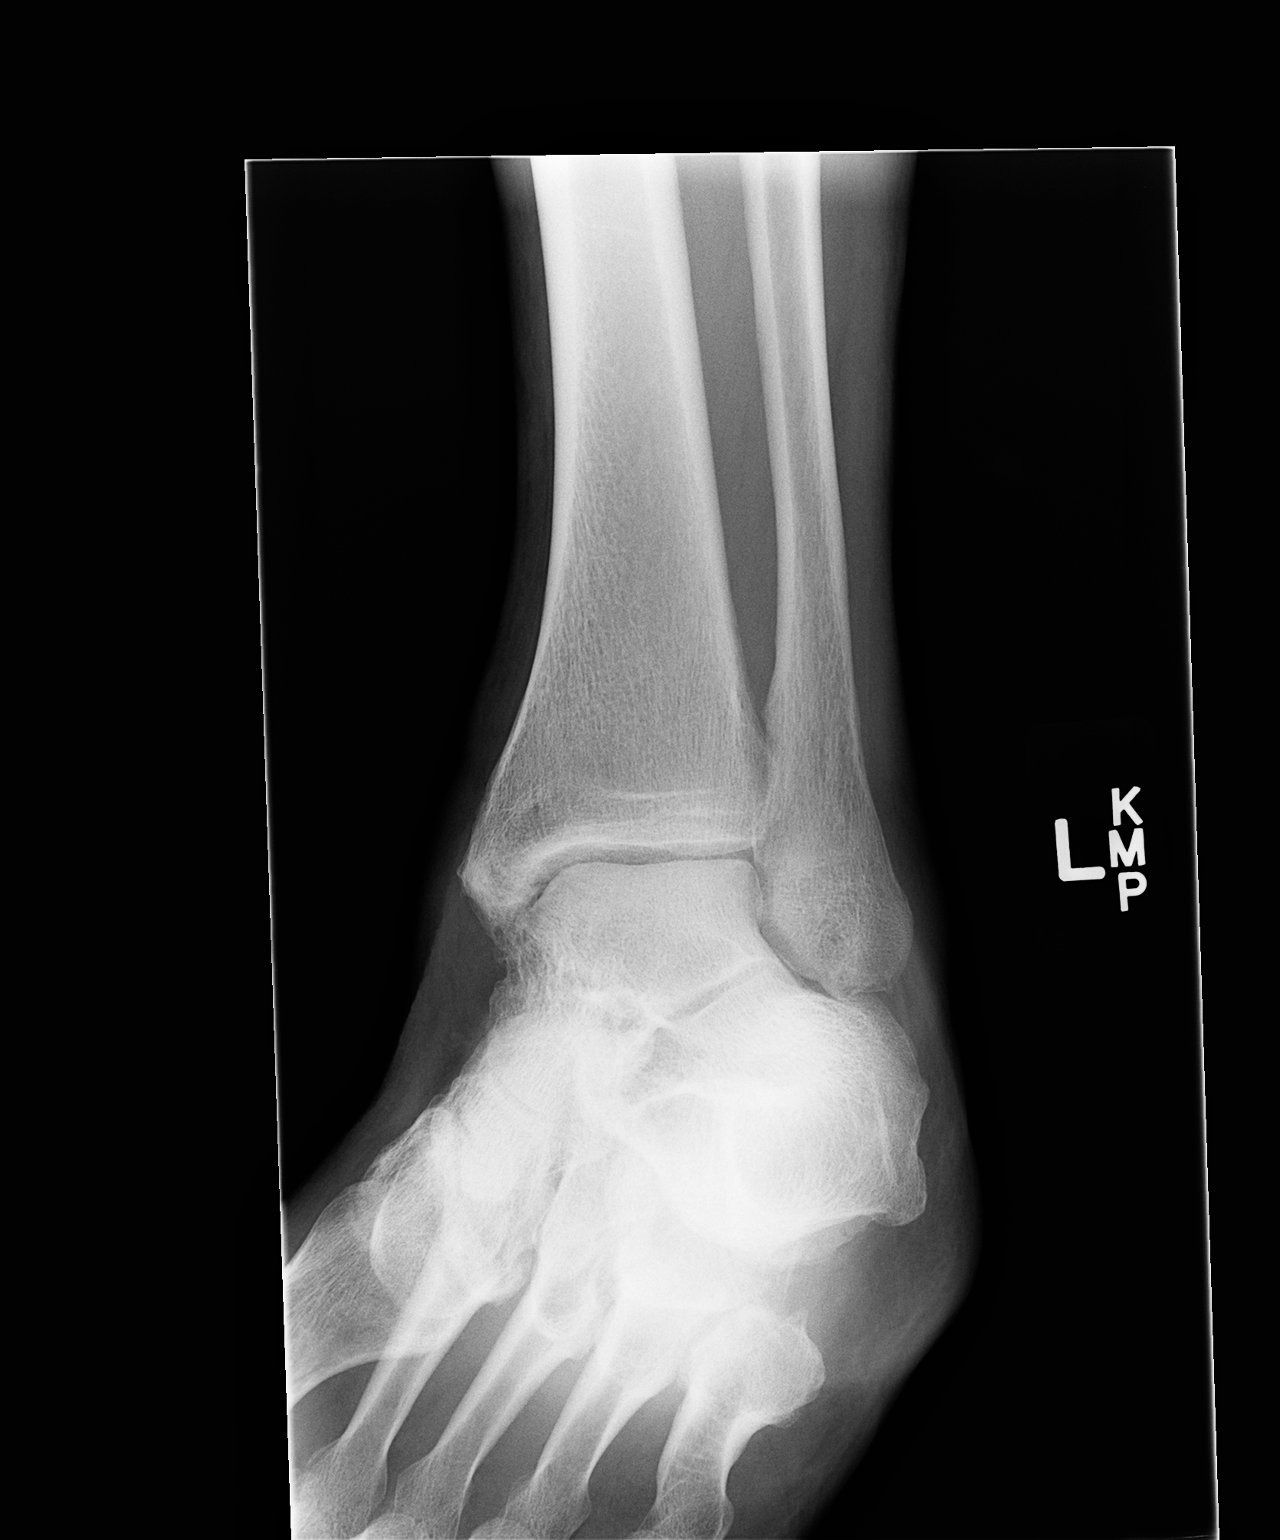

[view not recorded (3 of 3)]
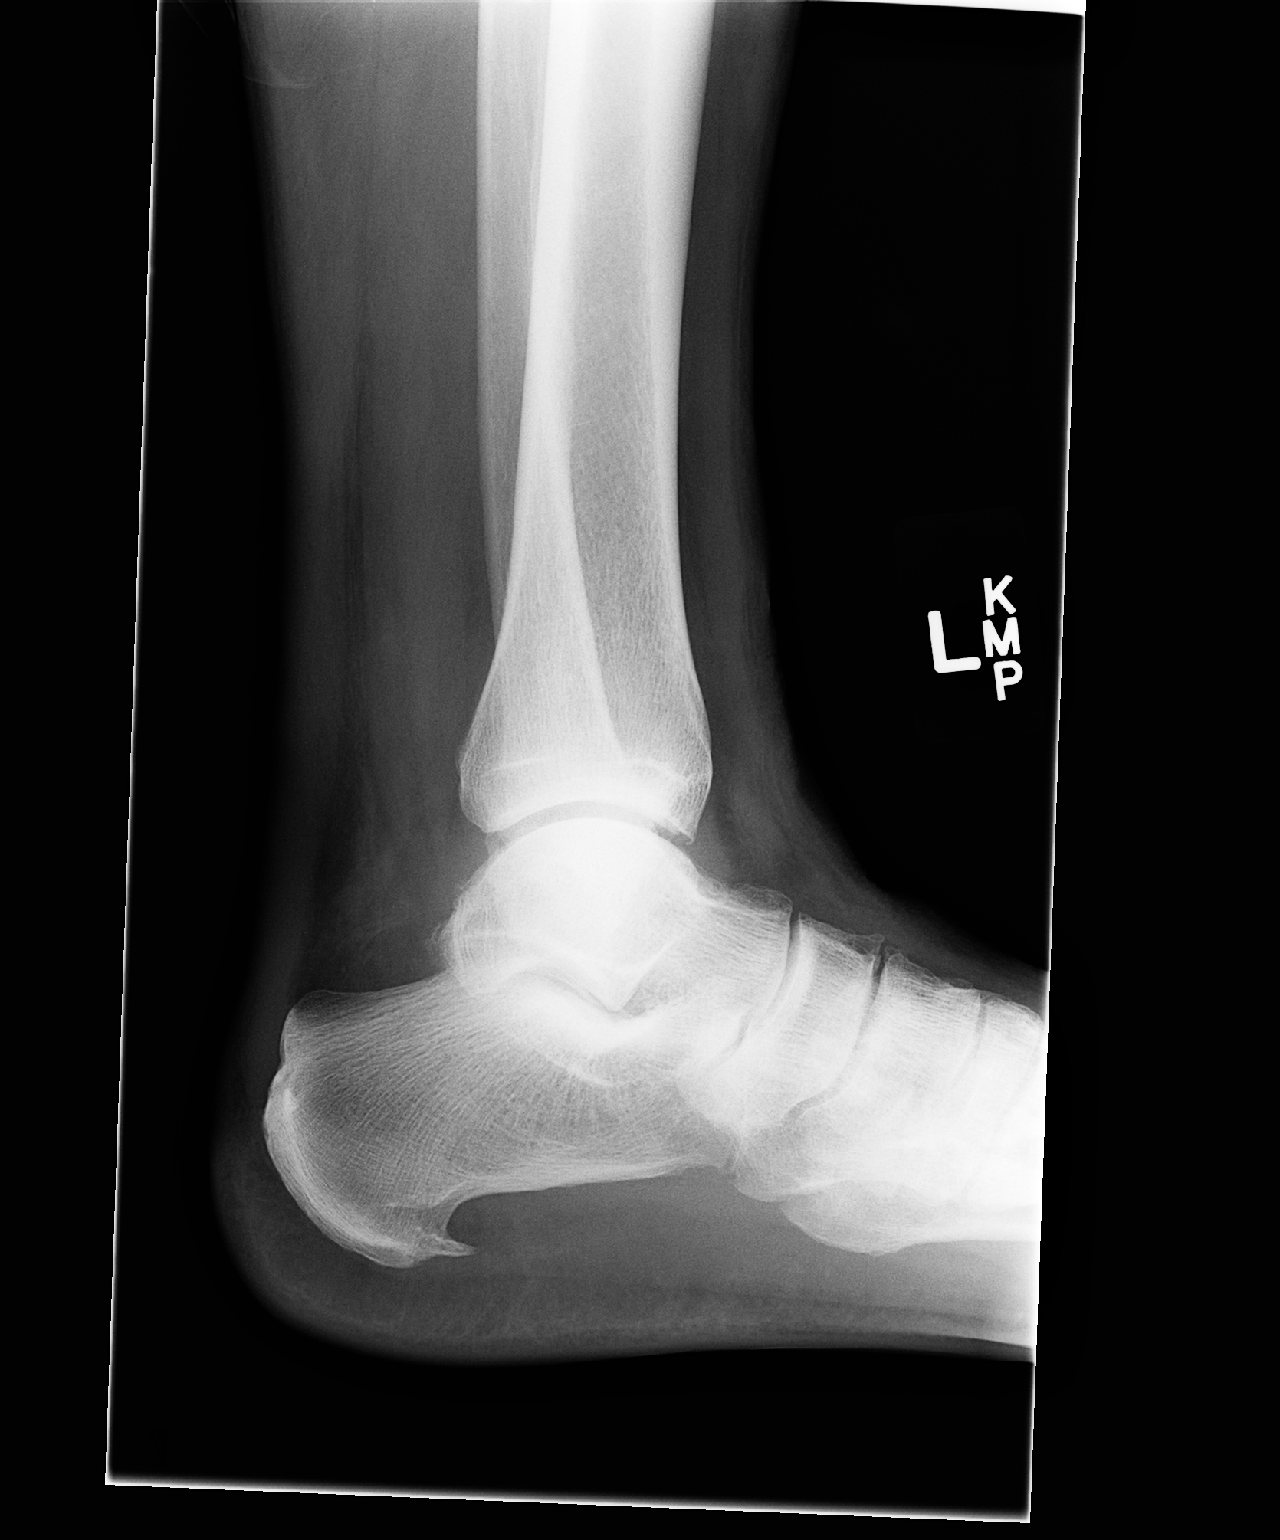

[3 of 3 positions shown; findings below may reference images not displayed]

FINDINGS: There is no fracture or dislocation. There is a small ankle joint
effusion. There is also a slight arthritis at the ankle joint bones
are well as between the navicular and the cuneiforms. Plantar
calcaneal spur.
IMPRESSION: No acute osseous abnormality. Arthritic changes. Small ankle joint
effusion.

## 2016-08-03 ENCOUNTER — Other Ambulatory Visit: Payer: Self-pay | Admitting: Family Medicine

## 2018-02-21 DIAGNOSIS — M7660 Achilles tendinitis, unspecified leg: Secondary | ICD-10-CM | POA: Insufficient documentation

## 2019-02-03 ENCOUNTER — Ambulatory Visit: Payer: Medicare PPO | Attending: Internal Medicine

## 2019-02-03 DIAGNOSIS — Z23 Encounter for immunization: Secondary | ICD-10-CM | POA: Insufficient documentation

## 2019-02-03 NOTE — Progress Notes (Signed)
   Covid-19 Vaccination Clinic  Name:  [name]    MRN: [id number] DOB: [birth year]  02/03/2019  [name] was observed post Covid-19 immunization for 15 minutes without incidence. He was provided with Vaccine Information Sheet and instruction to access the V-Safe system.   [name] was instructed to call 911 with any severe reactions post vaccine: Marland Kitchen Difficulty breathing  . Swelling of your face and throat  . A fast heartbeat  . A bad rash all over your body  . Dizziness and weakness    Immunizations Administered    Name Date Dose VIS Date Route   Pfizer COVID-19 Vaccine 02/03/2019 11:44 AM 0.3 mL 12/22/2018 Intramuscular   Manufacturer: [place]   Lot: [id number]   [place]: [id number]

## 2019-02-24 ENCOUNTER — Ambulatory Visit: Payer: Medicare PPO | Attending: Internal Medicine

## 2019-02-24 DIAGNOSIS — Z23 Encounter for immunization: Secondary | ICD-10-CM

## 2019-02-24 NOTE — Progress Notes (Signed)
   Covid-19 Vaccination Clinic  Name:  [name]    MRN: [id number] DOB: August 03, 1955  02/24/2019  [name] was observed post Covid-19 immunization for 15 minutes without incidence. He was provided with Vaccine Information Sheet and instruction to access the V-Safe system.   [name] was instructed to call 911 with any severe reactions post vaccine: Marland Kitchen Difficulty breathing  . Swelling of your face and throat  . A fast heartbeat  . A bad rash all over your body  . Dizziness and weakness    Immunizations Administered    Name Date Dose VIS Date Route   Pfizer COVID-19 Vaccine 02/24/2019  9:47 AM 0.3 mL 12/22/2018 Intramuscular   Manufacturer: [place]   Lot: [id number]   [place]: [id number]

## 2019-08-22 DIAGNOSIS — G4733 Obstructive sleep apnea (adult) (pediatric): Secondary | ICD-10-CM | POA: Diagnosis not present

## 2019-09-19 DIAGNOSIS — Z23 Encounter for immunization: Secondary | ICD-10-CM | POA: Diagnosis not present

## 2019-09-19 DIAGNOSIS — E559 Vitamin D deficiency, unspecified: Secondary | ICD-10-CM | POA: Diagnosis not present

## 2019-09-19 DIAGNOSIS — E538 Deficiency of other specified B group vitamins: Secondary | ICD-10-CM | POA: Diagnosis not present

## 2019-09-19 DIAGNOSIS — I10 Essential (primary) hypertension: Secondary | ICD-10-CM | POA: Diagnosis not present

## 2019-09-19 DIAGNOSIS — E7800 Pure hypercholesterolemia, unspecified: Secondary | ICD-10-CM | POA: Diagnosis not present

## 2019-09-19 DIAGNOSIS — M109 Gout, unspecified: Secondary | ICD-10-CM | POA: Diagnosis not present

## 2019-09-19 DIAGNOSIS — Z0000 Encounter for general adult medical examination without abnormal findings: Secondary | ICD-10-CM | POA: Diagnosis not present

## 2019-09-19 DIAGNOSIS — N529 Male erectile dysfunction, unspecified: Secondary | ICD-10-CM | POA: Diagnosis not present

## 2019-09-19 DIAGNOSIS — R7303 Prediabetes: Secondary | ICD-10-CM | POA: Diagnosis not present

## 2019-09-24 DIAGNOSIS — Z20828 Contact with and (suspected) exposure to other viral communicable diseases: Secondary | ICD-10-CM | POA: Diagnosis not present

## 2019-09-27 DIAGNOSIS — N5201 Erectile dysfunction due to arterial insufficiency: Secondary | ICD-10-CM | POA: Diagnosis not present

## 2019-09-27 DIAGNOSIS — C61 Malignant neoplasm of prostate: Secondary | ICD-10-CM | POA: Diagnosis not present

## 2020-03-18 DIAGNOSIS — R7303 Prediabetes: Secondary | ICD-10-CM | POA: Diagnosis not present

## 2020-03-18 DIAGNOSIS — E7800 Pure hypercholesterolemia, unspecified: Secondary | ICD-10-CM | POA: Diagnosis not present

## 2020-03-18 DIAGNOSIS — N529 Male erectile dysfunction, unspecified: Secondary | ICD-10-CM | POA: Diagnosis not present

## 2020-03-18 DIAGNOSIS — E538 Deficiency of other specified B group vitamins: Secondary | ICD-10-CM | POA: Diagnosis not present

## 2020-03-18 DIAGNOSIS — E559 Vitamin D deficiency, unspecified: Secondary | ICD-10-CM | POA: Diagnosis not present

## 2020-03-18 DIAGNOSIS — G4733 Obstructive sleep apnea (adult) (pediatric): Secondary | ICD-10-CM | POA: Diagnosis not present

## 2020-03-18 DIAGNOSIS — Z86010 Personal history of colonic polyps: Secondary | ICD-10-CM | POA: Diagnosis not present

## 2020-03-18 DIAGNOSIS — M109 Gout, unspecified: Secondary | ICD-10-CM | POA: Diagnosis not present

## 2020-03-18 DIAGNOSIS — I10 Essential (primary) hypertension: Secondary | ICD-10-CM | POA: Diagnosis not present

## 2020-08-20 DIAGNOSIS — G4733 Obstructive sleep apnea (adult) (pediatric): Secondary | ICD-10-CM | POA: Diagnosis not present

## 2020-09-24 DIAGNOSIS — C61 Malignant neoplasm of prostate: Secondary | ICD-10-CM | POA: Diagnosis not present

## 2020-10-01 DIAGNOSIS — Z0000 Encounter for general adult medical examination without abnormal findings: Secondary | ICD-10-CM | POA: Diagnosis not present

## 2020-10-01 DIAGNOSIS — E7800 Pure hypercholesterolemia, unspecified: Secondary | ICD-10-CM | POA: Diagnosis not present

## 2020-10-01 DIAGNOSIS — R7303 Prediabetes: Secondary | ICD-10-CM | POA: Diagnosis not present

## 2020-10-01 DIAGNOSIS — E538 Deficiency of other specified B group vitamins: Secondary | ICD-10-CM | POA: Diagnosis not present

## 2020-10-01 DIAGNOSIS — Z1159 Encounter for screening for other viral diseases: Secondary | ICD-10-CM | POA: Diagnosis not present

## 2020-10-01 DIAGNOSIS — M109 Gout, unspecified: Secondary | ICD-10-CM | POA: Diagnosis not present

## 2020-10-01 DIAGNOSIS — G4733 Obstructive sleep apnea (adult) (pediatric): Secondary | ICD-10-CM | POA: Diagnosis not present

## 2020-10-01 DIAGNOSIS — N5201 Erectile dysfunction due to arterial insufficiency: Secondary | ICD-10-CM | POA: Diagnosis not present

## 2020-10-01 DIAGNOSIS — N529 Male erectile dysfunction, unspecified: Secondary | ICD-10-CM | POA: Diagnosis not present

## 2020-10-01 DIAGNOSIS — Z23 Encounter for immunization: Secondary | ICD-10-CM | POA: Diagnosis not present

## 2020-10-01 DIAGNOSIS — E559 Vitamin D deficiency, unspecified: Secondary | ICD-10-CM | POA: Diagnosis not present

## 2020-10-01 DIAGNOSIS — I10 Essential (primary) hypertension: Secondary | ICD-10-CM | POA: Diagnosis not present

## 2020-10-01 DIAGNOSIS — C61 Malignant neoplasm of prostate: Secondary | ICD-10-CM | POA: Diagnosis not present

## 2021-03-31 DIAGNOSIS — Z86010 Personal history of colonic polyps: Secondary | ICD-10-CM | POA: Diagnosis not present

## 2021-03-31 DIAGNOSIS — N529 Male erectile dysfunction, unspecified: Secondary | ICD-10-CM | POA: Diagnosis not present

## 2021-03-31 DIAGNOSIS — E559 Vitamin D deficiency, unspecified: Secondary | ICD-10-CM | POA: Diagnosis not present

## 2021-03-31 DIAGNOSIS — E7800 Pure hypercholesterolemia, unspecified: Secondary | ICD-10-CM | POA: Diagnosis not present

## 2021-03-31 DIAGNOSIS — R7303 Prediabetes: Secondary | ICD-10-CM | POA: Diagnosis not present

## 2021-03-31 DIAGNOSIS — M109 Gout, unspecified: Secondary | ICD-10-CM | POA: Diagnosis not present

## 2021-03-31 DIAGNOSIS — I10 Essential (primary) hypertension: Secondary | ICD-10-CM | POA: Diagnosis not present

## 2021-03-31 DIAGNOSIS — G4733 Obstructive sleep apnea (adult) (pediatric): Secondary | ICD-10-CM | POA: Diagnosis not present

## 2021-03-31 DIAGNOSIS — E538 Deficiency of other specified B group vitamins: Secondary | ICD-10-CM | POA: Diagnosis not present

## 2021-04-08 DIAGNOSIS — Z86010 Personal history of colonic polyps: Secondary | ICD-10-CM | POA: Diagnosis not present

## 2021-04-08 DIAGNOSIS — D120 Benign neoplasm of cecum: Secondary | ICD-10-CM | POA: Diagnosis not present

## 2021-04-08 DIAGNOSIS — D124 Benign neoplasm of descending colon: Secondary | ICD-10-CM | POA: Diagnosis not present

## 2021-04-08 DIAGNOSIS — K5730 Diverticulosis of large intestine without perforation or abscess without bleeding: Secondary | ICD-10-CM | POA: Diagnosis not present

## 2021-04-08 DIAGNOSIS — D123 Benign neoplasm of transverse colon: Secondary | ICD-10-CM | POA: Diagnosis not present

## 2021-04-08 DIAGNOSIS — K649 Unspecified hemorrhoids: Secondary | ICD-10-CM | POA: Diagnosis not present

## 2021-04-10 DIAGNOSIS — D124 Benign neoplasm of descending colon: Secondary | ICD-10-CM | POA: Diagnosis not present

## 2021-08-19 DIAGNOSIS — G4733 Obstructive sleep apnea (adult) (pediatric): Secondary | ICD-10-CM | POA: Diagnosis not present

## 2021-10-10 DIAGNOSIS — Z23 Encounter for immunization: Secondary | ICD-10-CM | POA: Diagnosis not present

## 2021-10-26 DIAGNOSIS — E538 Deficiency of other specified B group vitamins: Secondary | ICD-10-CM | POA: Diagnosis not present

## 2021-10-26 DIAGNOSIS — G4733 Obstructive sleep apnea (adult) (pediatric): Secondary | ICD-10-CM | POA: Diagnosis not present

## 2021-10-26 DIAGNOSIS — E7800 Pure hypercholesterolemia, unspecified: Secondary | ICD-10-CM | POA: Diagnosis not present

## 2021-10-26 DIAGNOSIS — R7303 Prediabetes: Secondary | ICD-10-CM | POA: Diagnosis not present

## 2021-10-26 DIAGNOSIS — Z0000 Encounter for general adult medical examination without abnormal findings: Secondary | ICD-10-CM | POA: Diagnosis not present

## 2021-10-26 DIAGNOSIS — Z1331 Encounter for screening for depression: Secondary | ICD-10-CM | POA: Diagnosis not present

## 2021-10-26 DIAGNOSIS — E559 Vitamin D deficiency, unspecified: Secondary | ICD-10-CM | POA: Diagnosis not present

## 2021-10-26 DIAGNOSIS — M109 Gout, unspecified: Secondary | ICD-10-CM | POA: Diagnosis not present

## 2021-10-26 DIAGNOSIS — Z8546 Personal history of malignant neoplasm of prostate: Secondary | ICD-10-CM | POA: Diagnosis not present

## 2021-10-26 DIAGNOSIS — I10 Essential (primary) hypertension: Secondary | ICD-10-CM | POA: Diagnosis not present

## 2021-11-30 DIAGNOSIS — R059 Cough, unspecified: Secondary | ICD-10-CM | POA: Diagnosis not present

## 2021-11-30 DIAGNOSIS — J069 Acute upper respiratory infection, unspecified: Secondary | ICD-10-CM | POA: Diagnosis not present

## 2022-04-27 DIAGNOSIS — E7800 Pure hypercholesterolemia, unspecified: Secondary | ICD-10-CM | POA: Diagnosis not present

## 2022-04-27 DIAGNOSIS — I10 Essential (primary) hypertension: Secondary | ICD-10-CM | POA: Diagnosis not present

## 2022-04-27 DIAGNOSIS — G4733 Obstructive sleep apnea (adult) (pediatric): Secondary | ICD-10-CM | POA: Diagnosis not present

## 2022-04-27 DIAGNOSIS — M109 Gout, unspecified: Secondary | ICD-10-CM | POA: Diagnosis not present

## 2022-04-27 DIAGNOSIS — N529 Male erectile dysfunction, unspecified: Secondary | ICD-10-CM | POA: Diagnosis not present

## 2022-04-27 DIAGNOSIS — Z8546 Personal history of malignant neoplasm of prostate: Secondary | ICD-10-CM | POA: Diagnosis not present

## 2022-04-27 DIAGNOSIS — E538 Deficiency of other specified B group vitamins: Secondary | ICD-10-CM | POA: Diagnosis not present

## 2022-04-27 DIAGNOSIS — E559 Vitamin D deficiency, unspecified: Secondary | ICD-10-CM | POA: Diagnosis not present

## 2022-04-27 DIAGNOSIS — R7303 Prediabetes: Secondary | ICD-10-CM | POA: Diagnosis not present

## 2022-05-31 DIAGNOSIS — E871 Hypo-osmolality and hyponatremia: Secondary | ICD-10-CM | POA: Diagnosis not present

## 2022-10-23 DIAGNOSIS — Z23 Encounter for immunization: Secondary | ICD-10-CM | POA: Diagnosis not present

## 2022-10-28 DIAGNOSIS — E559 Vitamin D deficiency, unspecified: Secondary | ICD-10-CM | POA: Diagnosis not present

## 2022-10-28 DIAGNOSIS — I10 Essential (primary) hypertension: Secondary | ICD-10-CM | POA: Diagnosis not present

## 2022-10-28 DIAGNOSIS — E538 Deficiency of other specified B group vitamins: Secondary | ICD-10-CM | POA: Diagnosis not present

## 2022-10-28 DIAGNOSIS — E7800 Pure hypercholesterolemia, unspecified: Secondary | ICD-10-CM | POA: Diagnosis not present

## 2022-10-28 DIAGNOSIS — Z0000 Encounter for general adult medical examination without abnormal findings: Secondary | ICD-10-CM | POA: Diagnosis not present

## 2022-10-28 DIAGNOSIS — Z8546 Personal history of malignant neoplasm of prostate: Secondary | ICD-10-CM | POA: Diagnosis not present

## 2022-10-28 DIAGNOSIS — R4189 Other symptoms and signs involving cognitive functions and awareness: Secondary | ICD-10-CM | POA: Diagnosis not present

## 2022-10-28 DIAGNOSIS — R7303 Prediabetes: Secondary | ICD-10-CM | POA: Diagnosis not present

## 2022-10-28 DIAGNOSIS — Z23 Encounter for immunization: Secondary | ICD-10-CM | POA: Diagnosis not present

## 2022-10-28 DIAGNOSIS — Z08 Encounter for follow-up examination after completed treatment for malignant neoplasm: Secondary | ICD-10-CM | POA: Diagnosis not present

## 2022-10-29 ENCOUNTER — Encounter: Payer: Self-pay | Admitting: Physician Assistant

## 2022-11-11 ENCOUNTER — Other Ambulatory Visit (INDEPENDENT_AMBULATORY_CARE_PROVIDER_SITE_OTHER): Payer: Medicare PPO

## 2022-11-11 ENCOUNTER — Ambulatory Visit: Payer: Medicare PPO | Admitting: Physician Assistant

## 2022-11-11 ENCOUNTER — Encounter: Payer: Self-pay | Admitting: Physician Assistant

## 2022-11-11 ENCOUNTER — Ambulatory Visit: Payer: Medicare PPO

## 2022-11-11 VITALS — BP 120/70 | HR 74 | Resp 18 | Ht 72.0 in | Wt 225.0 lb

## 2022-11-11 DIAGNOSIS — R413 Other amnesia: Secondary | ICD-10-CM | POA: Diagnosis not present

## 2022-11-11 NOTE — Progress Notes (Signed)
Assessment/Plan:   [name] is a very pleasant 67 y.o. year old RH male with a history of hypertension, hyperlipidemia, OSA noncompliant with CPAP, vitamin D deficiency, history of prostate cancer, B12 deficiency, fatty liver, prediabetes, seen today for evaluation of memory loss. 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 Discussed alcohol discontinuation and he is interested .  MoCA today is 21/30. Workup is in progress. Patient is able to participate on his IADLs and continues to drive without significant difficulties.     Memory Impairment of unclear etiology  MRI brain without contrast to assess for underlying structural abnormality and assess vascular load  Neurocognitive testing to further evaluate cognitive concerns and determine other underlying cause of memory changes, including potential contribution from sleep, anxiety, attention, or depression  Will likely initiate ACHI after MRI results available Check TSH, B1  Continue to control mood as per PCP who may consider meds to help with anxiety during the alcohol discontinuation process  Recommend good control of cardiovascular risk factors [place] up in 3 months  Subjective:   The patient is accompanied by his wife  who supplements the history.   How long did patient have memory difficulties? For the last 6 to 8 years per wife's report, worse over the last year.  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 She discussed her observations with other wives 4 of them out of 7 noticed that their husbands were showing same cognitive and behavioral changes, but did not seek medical attention. According to her, he becomes distracted, has loss of attention.  At times, he becomes very agitated and seems confused and forgetful, needs repeated instructions. Reports some difficulty  remembering new information, conversations and names.  Long-term memory is good. 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 He has a daily routine, but if out of his routine, he asks "what are we doing next?". He likes to read the paper.  repeats oneself?  Endorsed Disoriented when walking into a room?  Patient denies   Leaving objects in unusual places? Denies.   Wandering behavior?  denies .  Any personality changes?   " He is quick to anger and has become impatient" "I started seeing it in 2017"-wife says.  Any history of depression?:  Denies   Hallucinations or paranoia?  Denies   Seizures?  Denies    Any sleep changes? Sleeps well with the CPAP but has not been using it.  "He does dream more than usual " Denies REM behavior or sleepwalking   Sleep apnea? Endorsed Any hygiene concerns?  Denies   Independent of bathing and dressing?  Endorsed  Does the patient needs help with medications? Patient is in charge   Who is in charge of the finances? Patient is in charge     Any changes in appetite? Eating well.  Patient have trouble swallowing? Denies.   Does the patient cook? Yes, denies any kitchen accidents.  Any kitchen accidents such as leaving the stove on? Denies.   Any history of headaches?  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   Chronic pain ? Denies.  Mostly arthritic in hips and knees Ambulates with difficulty?  Denies.   Recent falls or head injuries?  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 Vision changes? Needs glasses  to read.   Unilateral weakness, numbness or tingling? Denies.   Any tremors?   Denies.   Any anosmia?  Denies.   Any incontinence of urine? Endorsed, urge incontinence.gets up 3-4 times to urinate.  Any bowel dysfunction? Denies.      Patient lives with wife  History of heavy alcohol intake?  Endorsed, 8 12 oz beers a day.He has done so for many decades, he considers himself a functional  alcoholic.  History of heavy tobacco use?  Never smoked Family history of dementia? Denies.  Does patient drive? Yes, he got lost on 2 occasions.  Retired Museum/gallery curator, Runner, broadcasting/film/video, Charity fundraiser. Retired in 2013   Pertinent labs October 2024: Normal lipid panel, B12 560, vitamin D48.9, PSA 0.01, A1c 5.3,   Past Medical History:  Diagnosis Date   Arthritis    Hypercholesteremia    Hypertension    Prostate cancer (HCC)    Sleep apnea    USES C-PAP settings at 2      Past Surgical History:  Procedure Laterality Date   ACHILLES TENDON REPAIR  2009   KNEE ARTHROTOMY  1976   LEFT   PROSTATE BIOPSY     PROSTATECTOMY  2012   STERIOD INJECTION  08/20/2011   Procedure: STEROID INJECTION;  Surgeon: Kathryne Hitch, MD;  Location: [facility];  Service: Orthopedics;  Laterality: Left;  Left Hip Steroid Injection   TOTAL HIP ARTHROPLASTY  08/20/2011   Procedure: TOTAL HIP ARTHROPLASTY ANTERIOR APPROACH;  Surgeon: Kathryne Hitch, MD;  Location: [facility];  Service: Orthopedics;  Laterality: Right;  Right Total Hip Replacement, Anterior Approach   TOTAL HIP ARTHROPLASTY  12/17/2011   Procedure: TOTAL HIP ARTHROPLASTY ANTERIOR APPROACH;  Surgeon: Kathryne Hitch, MD;  Location: [facility];  Service: Orthopedics;  Laterality: Left;  Left Total Hip Arthroplasty, Anterior Approach     Allergies  Allergen Reactions   Lipitor [Atorvastatin] Cough   Codeine Rash    Current Outpatient Medications  Medication Instructions   aspirin EC 325 mg, Oral, 2 times daily   Cholecalciferol (VITAMIN D) 2000 UNITS CAPS 1 capsule, Daily   Cyanocobalamin (VITAMIN B-12 IJ) 1,000 mg, Every 30 days   fish oil-omega-3 fatty acids 1 g, Daily   glucosamine-chondroitin 500-400 MG tablet 1 tablet, 2 times daily   Multiple Vitamins-Minerals (MULTIVITAMINS THER. W/MINERALS) TABS 1 tablet, Daily   Probiotic Product (PHILLIPS COLON HEALTH PO) 1 capsule, Daily PRN   rosuvastatin (CRESTOR) 10 MG tablet     valsartan-hydrochlorothiazide (DIOVAN-HCT) 160-25 MG per tablet 1 tablet, Daily with breakfast     VITALS:   Vitals:   11/11/22 1313  BP: 96/64  Pulse: 74  Resp: 18  SpO2: 98%  Weight: 225 lb (102.1 kg)  Height: 6' (1.829 m)      PHYSICAL EXAM   HEENT:  Normocephalic, atraumatic. The superficial temporal arteries are without ropiness or tenderness. Cardiovascular: Regular rate and rhythm. Lungs: Clear to auscultation bilaterally. Neck: There are no carotid bruits noted bilaterally.  NEUROLOGICAL:    11/11/2022    1:00 PM  Montreal Cognitive Assessment   Visuospatial/ Executive (0/5) 3  Naming (0/3) 3  Attention: Read list of digits (0/2) 2  Attention: Read list of letters (0/1) 1  Attention: Serial 7 subtraction starting at 100 (0/3) 1  Language: Repeat phrase (0/2) 2  Language : Fluency (0/1) 1  Abstraction (0/2) 2  Delayed Recall (0/5) 0  Orientation (0/6) 6  Total 21  Adjusted Score (based on education) 21  No data to display           Orientation:  Alert and oriented to person, place and time. No aphasia or dysarthria. Fund of knowledge is appropriate. Recent memory impaired and remote memory intact.  Attention and concentration reduced  Able to name objects and repeat phrases. Delayed recall 0/5 Cranial nerves: There is good facial symmetry. Extraocular muscles are intact and visual fields are full to confrontational testing. Speech is fluent and clear. No tongue deviation. Hearing is intact to conversational tone. Tone: Tone is good throughout. Sensation: Sensation is intact to light touch. Vibration is intact at the bilateral big toe.  Coordination: The patient has no difficulty with RAM's or FNF bilaterally. Normal finger to nose  Motor: Strength is 5/5 in the bilateral upper and lower extremities. There is no pronator drift. There are no fasciculations noted. DTR's: Deep tendon reflexes are 2/4 bilaterally. Gait and Station: The patient is able to  ambulate without difficulty The patient is able to heel toe walk . Gait is cautious and narrow. The patient is able to ambulate in a tandem fashion.       Thank you for allowing [place] the opportunity to participate in the care of this nice patient. Please do not hesitate to contact us for any questions or concerns.   Total time spent on today's visit was 60 minutes dedicated to this patient today, preparing to see patient, examining the patient, ordering tests and/or medications and counseling the patient, documenting clinical information in the EHR or other health record, independently interpreting results and communicating results to the patient/family, discussing treatment and goals, answering patient's questions and coordinating care.  Cc:  Blair Heys, MD (Inactive)  Marlowe Kays 11/11/2022 2:21 PM

## 2022-11-11 NOTE — Patient Instructions (Addendum)

## 2022-11-12 LAB — TSH: TSH: 1.25 u[IU]/mL (ref 0.35–5.50)

## 2022-11-12 NOTE — Progress Notes (Signed)
Thyroid levels normal, waiting for B1, will take a few more days, thanks

## 2022-11-15 LAB — VITAMIN B1: Vitamin B1 (Thiamine): 19 nmol/L (ref 8–30)

## 2022-11-15 NOTE — Progress Notes (Signed)
B1 is normal, thanks

## 2022-12-31 ENCOUNTER — Telehealth: Payer: Self-pay

## 2022-12-31 NOTE — Telephone Encounter (Signed)
Mri not done, multiple attempts to reach patient, unable to get for MRI. FYI.

## 2023-01-26 ENCOUNTER — Encounter: Payer: Self-pay | Admitting: Physician Assistant

## 2023-01-26 ENCOUNTER — Ambulatory Visit: Payer: Medicare PPO | Admitting: Physician Assistant

## 2023-01-26 VITALS — BP 125/75 | HR 64 | Resp 20 | Ht 72.0 in | Wt 228.0 lb

## 2023-01-26 DIAGNOSIS — R413 Other amnesia: Secondary | ICD-10-CM

## 2023-01-26 NOTE — Patient Instructions (Signed)
 It was a pleasure to see you today at our office.   Recommendations:  Neurocognitive evaluation at our office   MRI of the brain, the radiology office will call you to arrange you appointment  [phone] Follow up in 3 months Discontinue alcohol intake  Consider anxiety pill while on withdrawal    For psychiatric meds, mood meds: Please have your primary care physician manage these medications.  If you have any severe symptoms of a stroke, or other severe issues such as confusion,severe chills or fever, etc call 911 or go to the ER as you may need to be evaluated further     For assessment of decision of mental capacity and competency:  Call Dr. Laverne Potter, geriatric psychiatrist at [phone]  Counseling regarding caregiver distress, including caregiver depression, anxiety and issues regarding community resources, adult day care programs, adult living facilities, or memory care questions:  please contact your  Primary Doctor's Social Worker   Whom to call: Memory  decline, memory medications: Call our office [phone]    [email or web address]   RECOMMENDATIONS FOR ALL PATIENTS WITH MEMORY PROBLEMS: 1. Continue to exercise (Recommend 30 minutes of walking everyday, or 3 hours every week) 2. Increase social interactions - continue going to [place] and enjoy social gatherings with friends and family 3. Eat healthy, avoid fried foods and eat more fruits and vegetables 4. Maintain adequate blood pressure, blood sugar, and blood cholesterol level. Reducing the risk of stroke and cardiovascular disease also helps promoting better memory. 5. Avoid stressful situations. Live a simple life and avoid aggravations. Organize your time and prepare for the next day in anticipation. 6. Sleep well, avoid any interruptions of sleep and avoid any distractions in the bedroom that may interfere with adequate sleep quality 7. Avoid sugar,  avoid sweets as there is a strong link between excessive sugar intake, diabetes, and cognitive impairment We discussed the Mediterranean diet, which has been shown to help patients reduce the risk of progressive memory disorders and reduces cardiovascular risk. This includes eating fish, eat fruits and green leafy vegetables, nuts like almonds and hazelnuts, walnuts, and also use olive oil. Avoid fast foods and fried foods as much as possible. Avoid sweets and sugar as sugar use has been linked to worsening of memory function.  There is always a concern of gradual progression of memory problems. If this is the case, then we may need to adjust level of care according to patient needs. Support, both to the patient and caregiver, should then be put into place.      You have been referred for a neuropsychological evaluation (i.e., evaluation of memory and thinking abilities). Please bring someone with you to this appointment if possible, as it is helpful for the doctor to hear from both you and another adult who knows you well. Please bring eyeglasses and hearing aids if you wear them.    The evaluation will take approximately 3 hours and has two parts:   The first part is a clinical interview with the neuropsychologist (Dr. Kitty Perkins or Dr. Annette Barters). During the interview, the neuropsychologist will speak with you and the individual you brought to the appointment.    The second part of the evaluation is testing with the doctor's technician Bernabe Brew or Burdette Carolin). During the testing, the technician will ask you to remember different types of material, solve problems, and answer some questionnaires. Your family member will not be present for this portion of the evaluation.   Please note: We must reserve several  hours of the neuropsychologist's time and the psychometrician's time for your evaluation appointment. As such, there is a No-Show fee of $100. If you are unable to attend any of your appointments, please contact  our office as soon as possible to reschedule.      DRIVING: Regarding driving, in patients with progressive memory problems, driving will be impaired. We advise to have someone else do the driving if trouble finding directions or if minor accidents are reported. Independent driving assessment is available to determine safety of driving.   If you are interested in the driving assessment, you can contact the following:  [facility] [employer] [facility] in [place] [phone]  [facility] [phone]  [facility] [phone]  [facility] [phone] or [phone]   FALL PRECAUTIONS: Be cautious when walking. Scan the area for obstacles that may increase the risk of trips and falls. When getting up in the mornings, sit up at the edge of the bed for a few minutes before getting out of bed. Consider elevating the bed at the head end to avoid drop of blood pressure when getting up. Walk always in a well-lit room (use night lights in the walls). Avoid area rugs or power cords from appliances in the middle of the walkways. Use a walker or a cane if necessary and consider physical therapy for balance exercise. Get your eyesight checked regularly.  FINANCIAL OVERSIGHT: Supervision, especially oversight when making financial decisions or transactions is also recommended.  HOME SAFETY: Consider the safety of the kitchen when operating appliances like stoves, microwave oven, and blender. Consider having supervision and share cooking responsibilities until no longer able to participate in those. Accidents with firearms and other hazards in the house should be identified and addressed as well.   ABILITY TO BE LEFT ALONE: If patient is unable to contact 911 operator, consider using LifeLine, or when the need is there, arrange for someone to stay with patients. Smoking is a fire hazard, consider supervision or cessation. Risk of wandering should be assessed by caregiver and  if detected at any point, supervision and safe proof recommendations should be instituted.  MEDICATION SUPERVISION: Inability to self-administer medication needs to be constantly addressed. Implement a mechanism to ensure safe administration of the medications.      Mediterranean Diet A Mediterranean diet refers to food and lifestyle choices that are based on the traditions of countries located on the [place]. This way of eating has been shown to help prevent certain conditions and improve outcomes for people who have chronic diseases, like kidney disease and heart disease. What are tips for following this plan? Lifestyle  Cook and eat meals together with your family, when possible. Drink enough fluid to keep your urine clear or pale yellow. Be physically active every day. This includes: Aerobic exercise like running or swimming. Leisure activities like gardening, walking, or housework. Get 7-8 hours of sleep each night. If recommended by your health care provider, drink red wine in moderation. This means 1 glass a day for nonpregnant women and 2 glasses a day for men. A glass of wine equals 5 oz (150 mL). Reading food labels  Check the serving size of packaged foods. For foods such as rice and pasta, the serving size refers to the amount of cooked product, not dry. Check the total fat in packaged foods. Avoid foods that have saturated fat or trans fats. Check the ingredients list for added sugars, such as corn syrup. Shopping  At the grocery store, buy  most of your food from the areas near the walls of the store. This includes: Fresh fruits and vegetables (produce). Grains, beans, nuts, and seeds. Some of these may be available in unpackaged forms or large amounts (in bulk). Fresh seafood. Poultry and eggs. Low-fat dairy products. Buy whole ingredients instead of prepackaged foods. Buy fresh fruits and vegetables in-season from local farmers markets. Buy frozen fruits and  vegetables in resealable bags. If you do not have access to quality fresh seafood, buy precooked frozen shrimp or canned fish, such as tuna, salmon, or sardines. Buy small amounts of raw or cooked vegetables, salads, or olives from the deli or salad bar at your store. Stock your pantry so you always have certain foods on hand, such as olive oil, canned tuna, canned tomatoes, rice, pasta, and beans. Cooking  Cook foods with extra-virgin olive oil instead of using butter or other vegetable oils. Have meat as a side dish, and have vegetables or grains as your main dish. This means having meat in small portions or adding small amounts of meat to foods like pasta or stew. Use beans or vegetables instead of meat in common dishes like chili or lasagna. Experiment with different cooking methods. Try roasting or broiling vegetables instead of steaming or sauteing them. Add frozen vegetables to soups, stews, pasta, or rice. Add nuts or seeds for added healthy fat at each meal. You can add these to yogurt, salads, or vegetable dishes. Marinate fish or vegetables using olive oil, lemon juice, garlic, and fresh herbs. Meal planning  Plan to eat 1 vegetarian meal one day each week. Try to work up to 2 vegetarian meals, if possible. Eat seafood 2 or more times a week. Have healthy snacks readily available, such as: Vegetable sticks with hummus. Greek yogurt. Fruit and nut trail mix. Eat balanced meals throughout the week. This includes: Fruit: 2-3 servings a day Vegetables: 4-5 servings a day Low-fat dairy: 2 servings a day Fish, poultry, or lean meat: 1 serving a day Beans and legumes: 2 or more servings a week Nuts and seeds: 1-2 servings a day Whole grains: 6-8 servings a day Extra-virgin olive oil: 3-4 servings a day Limit red meat and sweets to only a few servings a month What are my food choices? Mediterranean diet Recommended Grains: Whole-grain pasta. Brown rice. Bulgar wheat. Polenta.  Couscous. Whole-wheat bread. Dwyane Glad. Vegetables: Artichokes. Beets. Broccoli. Cabbage. Carrots. Eggplant. Green beans. Chard. Kale. Spinach. Onions. Leeks. Peas. Squash. Tomatoes. Peppers. Radishes. Fruits: Apples. Apricots. Avocado. Berries. Bananas. Cherries. Dates. Figs. Grapes. Lemons. Melon. Oranges. Peaches. Plums. Pomegranate. Meats and other protein foods: Beans. Almonds. Sunflower seeds. Pine nuts. Peanuts. Cod. Salmon. Scallops. Shrimp. Tuna. Tilapia. Clams. Oysters. Eggs. Dairy: Low-fat milk. Cheese. Greek yogurt. Beverages: Water. Red wine. Herbal tea. Fats and oils: Extra virgin olive oil. Avocado oil. Grape seed oil. Sweets and desserts: [place] yogurt with honey. Baked apples. Poached pears. Trail mix. Seasoning and other foods: Basil. Cilantro. Coriander. Cumin. Mint. Parsley. Sage. Rosemary. Tarragon. Garlic. Oregano. Thyme. Pepper. Balsalmic vinegar. Tahini. Hummus. Tomato sauce. Olives. Mushrooms. Limit these Grains: Prepackaged pasta or rice dishes. Prepackaged cereal with added sugar. Vegetables: Deep fried potatoes (french fries). Fruits: Fruit canned in syrup. Meats and other protein foods: Beef. Pork. Lamb. Poultry with skin. Hot dogs. Helene Loader. Dairy: Ice cream. Sour cream. Whole milk. Beverages: Juice. Sugar-sweetened soft drinks. Beer. Liquor and spirits. Fats and oils: Butter. Canola oil. Vegetable oil. Beef fat (tallow). Lard. Sweets and desserts: Cookies. Cakes. Pies. Candy. Seasoning and  other foods: Mayonnaise. Premade sauces and marinades. The items listed may not be a complete list. Talk with your dietitian about what dietary choices are right for you. Summary The Mediterranean diet includes both food and lifestyle choices. Eat a variety of fresh fruits and vegetables, beans, nuts, seeds, and whole grains. Limit the amount of red meat and sweets that you eat. Talk with your health care provider about whether it is safe for you to drink red wine in  moderation. This means 1 glass a day for nonpregnant women and 2 glasses a day for men. A glass of wine equals 5 oz (150 mL). This information is not intended to replace advice given to you by your health care provider. Make sure you discuss any questions you have with your health care provider. Document Released: 08/21/2015 Document Revised: 09/23/2015 Document Reviewed: 08/21/2015 Elsevier Interactive Patient Education  2017 [employer].

## 2023-01-26 NOTE — Progress Notes (Signed)
 Assessment/Plan:   Memory Impairment   [name] is a very pleasant 68 y.o. RH male with a history of hypertension, hyperlipidemia, OSA noncompliant with CPAP, vitamin D  deficiency, history of prostate cancer, B12 deficiency, fatty liver, prediabetes, multiple TBIs, alcohol habituation,  presenting today in follow-up for evaluation of memory loss. Patient is not on antidementia medications.  Patient is able to participate on his ADLs and to drive without difficulties. He will have his MRI performed soon (wife admits not picking the phone up because it showed as Spam).     Recommendations:   Follow up in 3  months. Patient to have his MRI of the brain performed this month, will review the results thereafter, to evaluate any structural abnormality and vascular load  We will consider initiating ACHI. Patient is scheduled for neuropsych evaluation in the near future to determine other underlying causes of memory changes. Alcohol cessation counseled, he has decreased intake significantly  recommend good control of cardiovascular risk factors Continue to control mood as per PCP    Subjective:   This patient is accompanied in the office by his wife who supplements the history. Previous records as well as any outside records available were reviewed prior to todays visit.   Patient was last seen on 10/2022 with [facility] 21/30     Any changes in memory since last visit? "Not really". According to wife He continues to become distracted, losing attention, needs repetition of instructions.  Reports some difficulty remembering information conversations and names. "He does not remember the basics of the discussion". LTM is good.  He has a daily routine as before, does not like to be taken out of this routine.  He likes to read the paper. His wife sends him reminders for appointments and she writes a calendar on the fridge that he can refer to.    Repeats oneself?  Endorsed Disoriented when walking  into a room?  Patient denies.   Misplacing objects?  Patient denies   Wandering behavior?   denies   Any personality changes since last visit?  As before, "for the last few years, he is quick to anger, he is impatient "-wife says. "He has pulled back the drinking and his mood has improved".-she says  Any worsening depression?: denies. Hallucinations or paranoia?  Denies.   Seizures?   Denies.    Any sleep changes? Sleeps well, does report vivid dreams, denies REM behavior or sleepwalking   Sleep apnea?  Endorsed, not using CPAP for th last 3-4 years .   Any hygiene concerns?   denies   Independent of bathing and dressing?  Endorsed  Does the patient needs help with medications? Patient is in charge   Who is in charge of the finances?  Patient is in charge     Any changes in appetite?  Denies.     Patient have trouble swallowing?  denies   Does the patient cook?  Yes, denies forgetting any common recipes. Any kitchen accidents such as leaving the stove on?   denies   Any headaches?    Denies.   Vision changes? Denies. Chronic pain?  Denies.   Ambulates with difficulty?    Denies  Recent falls or head injuries?    Denies.      Unilateral weakness, numbness or tingling?   Denies.   Any tremors?  Denies.   Any anosmia?    Denies.   Any incontinence of urine?  He has a history of urge incontinence and nocturia.  Any bowel dysfunction?  denies      Patient lives with his wife  Does the patient drive?  Yes, he denies any new episodes of becoming lost  Alcohol?  He continues to drink but has decreased to 4 12 ounce beers a day      Chest How long did patient have memory difficulties? For the last 6 to 8 years per wife's report, worse over the last year.  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 She discussed her observations with other wives 4 of them out of 7 noticed that their husbands were showing same cognitive and  behavioral changes, but did not seek medical attention. According to her, he becomes distracted, has loss of attention.  At times, he becomes very agitated and seems confused and forgetful, needs repeated instructions. Reports some difficulty remembering new information, conversations and names.  Long-term memory is good. 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 He has a daily routine, but if out of his routine, he asks "what are we doing next?". He likes to read the paper.  repeats oneself?  Endorsed Disoriented when walking into a room?  Patient denies   Leaving objects in unusual places? Denies.   Wandering behavior?  denies .  Any personality changes?   " He is quick to anger and has become impatient" "I started seeing it in 2017"-wife says.  Any history of depression?:  Denies   Hallucinations or paranoia?  Denies   Seizures?  Denies    Any sleep changes? Sleeps well with the CPAP but has not been using it.  "He does dream more than usual " Denies REM behavior or sleepwalking   Sleep apnea? Endorsed Any hygiene concerns?  Denies   Independent of bathing and dressing?  Endorsed  Does the patient needs help with medications? Patient is in charge   Who is in charge of the finances? Patient is in charge     Any changes in appetite? Eating well.  Patient have trouble swallowing? Denies.   Does the patient cook? Yes, denies any kitchen accidents.  Any kitchen accidents such as leaving the stove on? Denies.   Any history of headaches?  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   Chronic pain ? Denies.  Mostly arthritic in hips and knees Ambulates with difficulty?  Denies.   Recent falls or head injuries?  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 Vision changes? Needs glasses to read.   Unilateral weakness, numbness or tingling? Denies.   Any tremors?   Denies.   Any anosmia?  Denies.   Any incontinence of urine? Endorsed,  urge incontinence.gets up 3-4 times to urinate.  Any bowel dysfunction? Denies.      Patient lives with wife  History of heavy alcohol intake?  Endorsed, 8 12 oz beers a day.He has done so for many decades, he considers himself a functional alcoholic.  History of heavy tobacco use?  Never smoked Family history of dementia? Denies.  Does patient drive? Yes, he got lost on 2 occasions.  Retired Museum/gallery curator, Runner, broadcasting/film/video, Charity fundraiser. Retired in 2013    Pertinent labs October 2024: Normal lipid panel, B12 560, vitamin D48.9, PSA 0.01, A1c 5.3,  Past Medical History:  Diagnosis Date   Arthritis    Hypercholesteremia    Hypertension    Prostate cancer (HCC)    Sleep apnea  USES C-PAP settings at 1      Past Surgical History:  Procedure Laterality Date   ACHILLES TENDON REPAIR  2009   KNEE ARTHROTOMY  1976   LEFT   PROSTATE BIOPSY     PROSTATECTOMY  2012   STERIOD INJECTION  08/20/2011   Procedure: STEROID INJECTION;  Surgeon: Arnie Lao, MD;  Location: [facility];  Service: Orthopedics;  Laterality: Left;  Left Hip Steroid Injection   TOTAL HIP ARTHROPLASTY  08/20/2011   Procedure: TOTAL HIP ARTHROPLASTY ANTERIOR APPROACH;  Surgeon: Arnie Lao, MD;  Location: [facility];  Service: Orthopedics;  Laterality: Right;  Right Total Hip Replacement, Anterior Approach   TOTAL HIP ARTHROPLASTY  12/17/2011   Procedure: TOTAL HIP ARTHROPLASTY ANTERIOR APPROACH;  Surgeon: Arnie Lao, MD;  Location: [facility];  Service: Orthopedics;  Laterality: Left;  Left Total Hip Arthroplasty, Anterior Approach     PREVIOUS MEDICATIONS:   CURRENT MEDICATIONS:  Outpatient Encounter Medications as of 01/26/2023  Medication Sig   aspirin  EC 325 MG EC tablet Take 1 tablet (325 mg total) by mouth 2 (two) times daily.   Cholecalciferol  (VITAMIN D ) 2000 UNITS CAPS Take 1 capsule by mouth daily.    Cyanocobalamin  (VITAMIN B-12 IJ) Inject 1,000 mg as directed every 30 (thirty) days.    Multiple Vitamins-Minerals (MULTIVITAMINS THER. W/MINERALS) TABS Take 1 tablet by mouth daily.    rosuvastatin (CRESTOR) 10 MG tablet    valsartan -hydrochlorothiazide  (DIOVAN -HCT) 160-25 MG per tablet Take 1 tablet by mouth daily with breakfast.    fish oil-omega-3 fatty acids 1000 MG capsule Take 1 g by mouth daily.  (Patient not taking: Reported on 11/11/2022)   glucosamine-chondroitin 500-400 MG tablet Take 1 tablet by mouth 2 (two) times daily. (Patient not taking: Reported on 11/11/2022)   Probiotic Product (PHILLIPS COLON HEALTH PO) Take 1 capsule by mouth daily as needed. constipation (Patient not taking: Reported on 11/11/2022)   No facility-administered encounter medications on file as of 01/26/2023.     Objective:     PHYSICAL EXAMINATION:    VITALS:   Vitals:   01/26/23 0840  BP: 125/75  Pulse: 64  Resp: 20  SpO2: 98%  Weight: 228 lb (103.4 kg)  Height: 6' (1.829 m)    GEN:  The patient appears stated age and is in NAD. HEENT:  Normocephalic, atraumatic.   Neurological examination:  General: NAD, well-groomed, appears stated age. Orientation: The patient is alert. Oriented to person, place and date Cranial nerves: There is good facial symmetry.The speech is fluent and clear. No aphasia or dysarthria. Fund of knowledge is appropriate. Recent memory impaired and remote memory is normal.  Attention and concentration are decreased.  Able to name objects and repeat phrases.  Hearing is intact to conversational tone.    Sensation: Sensation is intact to light touch throughout Motor: Strength is at least antigravity x4. DTR's 2/4 in UE/LE      11/11/2022    1:00 PM  Montreal Cognitive Assessment   Visuospatial/ Executive (0/5) 3  Naming (0/3) 3  Attention: Read list of digits (0/2) 2  Attention: Read list of letters (0/1) 1  Attention: Serial 7 subtraction starting at 100 (0/3) 1  Language: Repeat phrase (0/2) 2  Language : Fluency (0/1) 1  Abstraction (0/2) 2   Delayed Recall (0/5) 0  Orientation (0/6) 6  Total 21  Adjusted Score (based on education) 21        No data to display  Movement examination: Tone: There is normal tone in the UE/LE Abnormal movements:  no tremor.  No myoclonus.  No asterixis.   Coordination:  There is no decremation with RAM's. Normal finger to nose  Gait and Station: The patient has no difficulty arising out of a deep-seated chair without the use of the hands. The patient's stride length is good.  Gait is cautious and narrow.   Thank you for allowing us  the opportunity to participate in the care of this nice patient. Please do not hesitate to contact us  for any questions or concerns.   Total time spent on today's visit was 36 minutes dedicated to this patient today, preparing to see patient, examining the patient, ordering tests and/or medications and counseling the patient, documenting clinical information in the EHR or other health record, independently interpreting results and communicating results to the patient/family, discussing treatment and goals, answering patient's questions and coordinating care.  Cc:  Elida Grounds, DO  [facility] 01/26/2023 9:21 AM

## 2023-02-07 ENCOUNTER — Encounter: Payer: Self-pay | Admitting: Psychology

## 2023-02-07 DIAGNOSIS — K760 Fatty (change of) liver, not elsewhere classified: Secondary | ICD-10-CM | POA: Insufficient documentation

## 2023-02-07 DIAGNOSIS — Z87820 Personal history of traumatic brain injury: Secondary | ICD-10-CM | POA: Insufficient documentation

## 2023-02-07 DIAGNOSIS — F1090 Alcohol use, unspecified, uncomplicated: Secondary | ICD-10-CM | POA: Insufficient documentation

## 2023-02-07 DIAGNOSIS — G4733 Obstructive sleep apnea (adult) (pediatric): Secondary | ICD-10-CM | POA: Insufficient documentation

## 2023-02-07 DIAGNOSIS — E538 Deficiency of other specified B group vitamins: Secondary | ICD-10-CM | POA: Insufficient documentation

## 2023-02-07 DIAGNOSIS — E7800 Pure hypercholesterolemia, unspecified: Secondary | ICD-10-CM | POA: Insufficient documentation

## 2023-02-07 DIAGNOSIS — I10 Essential (primary) hypertension: Secondary | ICD-10-CM | POA: Insufficient documentation

## 2023-02-07 DIAGNOSIS — R7303 Prediabetes: Secondary | ICD-10-CM | POA: Insufficient documentation

## 2023-02-08 ENCOUNTER — Ambulatory Visit: Payer: Self-pay

## 2023-02-08 ENCOUNTER — Other Ambulatory Visit: Payer: Self-pay | Admitting: Physician Assistant

## 2023-02-08 ENCOUNTER — Ambulatory Visit: Payer: Medicare PPO | Admitting: Psychology

## 2023-02-08 ENCOUNTER — Institutional Professional Consult (permissible substitution): Payer: Medicare PPO | Admitting: Psychology

## 2023-02-08 ENCOUNTER — Encounter: Payer: Self-pay | Admitting: Psychology

## 2023-02-08 DIAGNOSIS — Z87820 Personal history of traumatic brain injury: Secondary | ICD-10-CM

## 2023-02-08 DIAGNOSIS — F1090 Alcohol use, unspecified, uncomplicated: Secondary | ICD-10-CM

## 2023-02-08 DIAGNOSIS — R4189 Other symptoms and signs involving cognitive functions and awareness: Secondary | ICD-10-CM

## 2023-02-08 DIAGNOSIS — G3184 Mild cognitive impairment, so stated: Secondary | ICD-10-CM | POA: Diagnosis not present

## 2023-02-08 HISTORY — DX: Mild cognitive impairment of uncertain or unknown etiology: G31.84

## 2023-02-08 NOTE — Progress Notes (Signed)
   Psychometrician Note   Cognitive testing was administered to [name] by Shan Levans, B.S. (psychometrist) under the supervision of Dr. Newman Nickels, Ph.D., licensed psychologist on 02/08/2023. [name] did not appear overtly distressed by the testing session per behavioral observation or responses across self-report questionnaires. Rest breaks were offered.    The battery of tests administered was selected by Dr. Newman Nickels, Ph.D. with consideration to [name] current level of functioning, the nature of his symptoms, emotional and behavioral responses during interview, level of literacy, observed level of motivation/effort, and the nature of the referral question. This battery was communicated to the psychometrist. Communication between Dr. Newman Nickels, Ph.D. and the psychometrist was ongoing throughout the evaluation and Dr. Newman Nickels, Ph.D. was immediately accessible at all times. Dr. Newman Nickels, Ph.D. provided supervision to the psychometrist on the date of this service to the extent necessary to assure the quality of all services provided.    [name] will return within approximately 1-2 weeks for an interactive feedback session with Dr. Milbert Coulter at which time his test performances, clinical impressions, and treatment recommendations will be reviewed in detail. [name] understands he can contact our office should he require our assistance before this time.  A total of 115 minutes of billable time were spent face-to-face with Mr. Benney by the psychometrist. This includes both test administration and scoring time. Billing for these services is reflected in the clinical report generated by Dr. Newman Nickels, Ph.D.  This note reflects time spent with the psychometrician and does not include test scores or any clinical interpretations made by Dr. Milbert Coulter. The full report will follow in a separate note.

## 2023-02-08 NOTE — Progress Notes (Signed)
NEUROPSYCHOLOGICAL EVALUATION [name]. [facility] Department of Neurology  Date of Evaluation: February 08, 2023  Reason for Referral:   [name] is a 68 y.o. right-handed Caucasian male referred by Douglas Kays, PA-C, to characterize his current cognitive functioning and assist with diagnostic clarity and treatment planning in the context of subjective cognitive decline.   Assessment and Plan:   Clinical Impression(s): Scores across stand-alone and embedded performance validity measures were variable. His sole below expectation performance did approach appropriate clinical cut-offs. I do not have concern for poor engagement or attempts to perform poorly across the current evaluation and know of no secondary gain for diminished performances. This below expectation performance could simply be related to true memory impairment. Overall, while mild caution surrounding test interpretation may be prudent, I do feel that the results of the current evaluation are a reasonably valid representation of [name]' current cognitive functioning.  If taken at face value, [name]' pattern of performance is suggestive of diffuse cognitive dysfunction. Severe impairment was exhibited across all aspects of learning and memory. Additional impairments were exhibited across processing speed, executive functioning, and semantic fluency. Some variability was further exhibited across visuospatial tasks; however, all but one of these tasks was normatively appropriate. Performances were also appropriate relative to age-matched peers across attention/concentration, abstract reasoning, receptive language, phonemic fluency, and confrontation naming. [name] denied difficulties completing instrumental activities of daily living (ADLs) independently. His significant other was largely in agreement. As such, given evidence for cognitive dysfunction described above, he meets criteria for a Mild  Neurocognitive Disorder ("mild cognitive impairment") at the present time.  The etiology for ongoing cognitive impairment is unclear. Notably, diagnostic clarity is significantly hampered by ongoing alcohol abuse/dependence. Notes in his medical record as recent as this past October suggest the consumption of eight beers daily. Currently, he indicated consumption of five beers daily with his significant other separately suggesting that this was likely an underestimation. Alcohol abuse has been chronic and he has described himself as a "functional alcoholic" in the past. This degree of alcohol abuse certainly has the potential to dramatically impact memory and other cognitive abilities. There remains the potential that this represents the primary source for ongoing impairment and he is certainly at an elevated risk for an alcohol-related dementia presentation in the future. These difficulties would be further exacerbated by untreated sleep apnea.   Neurologically speaking, I cannot rule out the presence of illness such as Alzheimer's disease. He was fully amnestic (i.e., 0% retention) after brief delays across 2/3 verbal memory tasks and only retained 11% of a third task. While retention rates were improved across a visual memory task, he likely benefited from happenstance guessing and he made comments during task administration to further suggest this. His performance was consistently poor across yes/no recognition trials. Taken together, this does raise concern for rapid forgetting and a prominent storage impairment, which can be hallmark testing patterns in this illness. However, as stated above, symptom overlap between Alzheimer's disease and an alcohol-related dementia presentation is quite significant as amnestic memory patterns can be seen in both cases. While Alzheimer's disease cannot be ruled in with confidence, it certainly should not be ruled out and continued testing and monitoring will be very  important moving forward.   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 While they did not explicitly state this, I perceived underlying concerns for Chronic Traumatic Encephalopathy (CTE).  It is important to highlight that, despite what is often encountered in mass media outlets, this illness remains highly controversial. There are no current diagnostic criteria or established symptoms unique to this illness. As it stands currently, it is a pathological syndrome which cannot be diagnosed in a living individual. I cannot comment on if his concussion history is playing an active role in his current presentation. Regardless of its degree of contribution, current alcohol rates would surpass his concussion history as a more pressing concern in my opinion.   Recommendations: The CDC defines "heavy drinking" in men as the consumption of 15 or more alcoholic beverages per week. Given reported alcohol consumption levels, [name] is dramatically exceeding this and has consistently done so for quite some time. Chronic heavy drinking is associated with negative health outcomes and, as stated above, increases risk for an alcohol-related dementia presentation. It will also serve to worsen and accelerate changes due to Alzheimer's disease or other neurological conditions if one of these is present. He is strongly encouraged to reduce daily consumption and seek out rehabilitation efforts if necessary.   To provide greater diagnostic clarity for Alzheimer's disease concerns in particular, I would recommend that he discuss a referral for a lumbar puncture with [name]. This would aid in understanding the presence of any pathological changes associated with this illness.  Untreated sleep apnea will also negatively impact memory and other thinking abilities. It will further increase his risk for heart attack, stroke, and progression  to a formal dementia diagnosis in the future. He is strongly encouraged to actively treat this condition nightly and may wish to meet with a sleep specialist to discuss eligibility surrounding the [facility] device.  [name] may wish to discuss medication aimed to address memory loss with [name]. It is important to highlight that these medications have been shown to slow functional decline in some individuals. There is no current treatment which can stop or reverse cognitive decline when caused by a neurodegenerative illness. As alluded to above, the effectiveness of these medications will be hampered by ongoing alcohol abuse.   Performance across neurocognitive testing is not a strong predictor of an individual's safety operating a motor vehicle. Should his family wish to pursue a formalized driving evaluation, they could reach out to the following agencies: [facility] [employer] [facility] in [place]: [phone] [facility]: [phone] [facility]: [phone] [facility]: [phone] or [phone]  Should there be progression of current deficits over time, [name] is unlikely to regain any independent living skills lost. Therefore, it is recommended that he remain as involved as possible in all aspects of household chores, finances, and medication management, with supervision to ensure adequate performance. He will likely benefit from the establishment and maintenance of a routine in order to maximize his functional abilities over time.  It will be important for [name] to have another person with him when in situations where he may need to process information, weigh the pros and cons of different options, and make decisions, in order to ensure that he fully understands and recalls all information to be considered.  If not already done, [name] and his family may want to discuss his wishes regarding durable power of attorney and medical decision making,  so that he can have input into these choices. If they require legal assistance with this, long-term care resource access, or other aspects of estate planning, they could reach out to [employer] at [phone] for a free  consultation.   [name] is encouraged to attend to lifestyle factors for brain health (e.g., regular physical exercise, good nutrition habits and consideration of the MIND-DASH diet, regular participation in cognitively-stimulating activities, and general stress management techniques), which are likely to have benefits for both emotional adjustment and cognition. Optimal control of vascular risk factors (including safe cardiovascular exercise and adherence to dietary recommendations) is encouraged. Continued participation in activities which provide mental stimulation and social interaction is also recommended.   Important information should be provided to [name] in written format in all instances. This information should be placed in a highly frequented and easily visible location within his home to promote recall. External strategies such as written notes in a consistently used memory journal, visual and nonverbal auditory cues such as a calendar on the refrigerator or appointments with alarm, such as on a cell phone, can also help maximize recall.  To address problems with processing speed, he may wish to consider:   -Ensuring that he is alerted when essential material or instructions are being presented   -Adjusting the speed at which new information is presented   -Allowing for more time in comprehending, processing, and responding in conversation   -Repeating and paraphrasing instructions or conversations aloud  To address problems with fluctuating attention and/or executive dysfunction, he may wish to consider:   -Avoiding external distractions when needing to concentrate   -Limiting exposure to fast paced environments with multiple sensory demands   -Writing down  complicated information and using checklists   -Attempting and completing one task at a time (i.e., no multi-tasking)   -Verbalizing aloud each step of a task to maintain focus   -Taking frequent breaks during the completion of steps/tasks to avoid fatigue   -Reducing the amount of information considered at one time   -Scheduling more difficult activities for a time of day where he is usually most alert  Review of Records:   [name] was seen by [facility] Douglas Kays, PA-C) on 11/11/2022 for an evaluation of memory loss. At that time, his wife reported cognitive decline over the past 6-8 years but more noticeable during the past year. Examples surrounded increased forgetfulness, trouble recalling names and details of recent conversations, and trouble with focus and attention. 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 There were also prominent alcohol abuse concerns (8 beers daily). ADL dysfunction was denied. Ultimately, [name] was referred for a comprehensive neuropsychological evaluation to characterize his cognitive abilities and to assist with diagnostic clarity and treatment planning.   Neuroimaging: No neuroimaging was available for review. A brain MRI had been ordered but not yet scheduled at the time of this writing.   Past Medical History:  Diagnosis Date   Alcohol use disorder    Arthritis    Fatty liver    History of multiple concussions    football related   Hypercholesteremia    Hypertension    Obstructive sleep apnea    CPAP noncompliance   Prediabetes    Prostate cancer    Vitamin B12 deficiency     Past Surgical History:  Procedure Laterality Date   ACHILLES TENDON REPAIR  2009   KNEE ARTHROTOMY  1976   LEFT   PROSTATE BIOPSY     PROSTATECTOMY  2012   STERIOD INJECTION  08/20/2011   Procedure: STEROID INJECTION;  Surgeon: Kathryne Hitch, MD;  Location: [facility];  Service:  Orthopedics;  Laterality: Left;  Left Hip Steroid Injection   TOTAL HIP ARTHROPLASTY  08/20/2011   Procedure: TOTAL HIP ARTHROPLASTY ANTERIOR APPROACH;  Surgeon: Kathryne Hitch, MD;  Location: [facility];  Service: Orthopedics;  Laterality: Right;  Right Total Hip Replacement, Anterior Approach   TOTAL HIP ARTHROPLASTY  12/17/2011   Procedure: TOTAL HIP ARTHROPLASTY ANTERIOR APPROACH;  Surgeon: Kathryne Hitch, MD;  Location: [facility];  Service: Orthopedics;  Laterality: Left;  Left Total Hip Arthroplasty, Anterior Approach    Current Outpatient Medications:    aspirin EC 325 MG EC tablet, Take 1 tablet (325 mg total) by mouth 2 (two) times daily., Disp: 30 tablet, Rfl: 0   Cholecalciferol (VITAMIN D) 2000 UNITS CAPS, Take 1 capsule by mouth daily. , Disp: , Rfl:    Cyanocobalamin (VITAMIN B-12 IJ), Inject 1,000 mg as directed every 30 (thirty) days., Disp: , Rfl:    fish oil-omega-3 fatty acids 1000 MG capsule, Take 1 g by mouth daily.  (Patient not taking: Reported on 11/11/2022), Disp: , Rfl:    glucosamine-chondroitin 500-400 MG tablet, Take 1 tablet by mouth 2 (two) times daily. (Patient not taking: Reported on 11/11/2022), Disp: , Rfl:    Multiple Vitamins-Minerals (MULTIVITAMINS THER. W/MINERALS) TABS, Take 1 tablet by mouth daily. , Disp: , Rfl:    Probiotic Product (PHILLIPS COLON HEALTH PO), Take 1 capsule by mouth daily as needed. constipation (Patient not taking: Reported on 11/11/2022), Disp: , Rfl:    rosuvastatin (CRESTOR) 10 MG tablet, , Disp: , Rfl:    valsartan-hydrochlorothiazide (DIOVAN-HCT) 160-25 MG per tablet, Take 1 tablet by mouth daily with breakfast. , Disp: , Rfl:   Clinical Interview:   The following information was obtained during a clinical interview with [name] and his significant other prior to cognitive testing.  Cognitive Symptoms: Decreased short-term memory: Endorsed. He described fairly longstanding weaknesses surrounding forgetfulness and trouble  with name recollection. He did not feel that this had progressively worsened over time. His significant other noted more prominent memory concerns, largely surrounding short-term dysfunction, increased repetition in day-to-day conversation, and trouble recalling names and details of conversations. Per her, difficulties have been observed during the past five years but been more noticeable over the past three. She also noted that [name]' daughter had observed some cognitive change and had expressed concern as well.  Decreased long-term memory: Denied. Decreased attention/concentration: Denied. Reduced processing speed: Denied. Difficulties with executive functions: Denied. Difficulties with emotion regulation: Denied. His significant other noted some personality changes in that [name]. Sahli is very quick to get frustrated when he cannot remember something. She noted that he seems to be far more easily irritable and have a much shorter fuse. This does represent a change observed over the past few years from his previous personality where he used to let "everything roll off the back." Difficulties with receptive language: Denied. Difficulties with word finding: Denied. Decreased visuoperceptual ability: Denied.  Difficulties completing ADLs: Denied. His significant other did note that he had seemed slower to process directions while driving to even familiar locations.   Additional Medical History: History of traumatic brain injury/concussion: Endorsed. He played organized football in middle school, high school, college, and semi-professional capacities. He described several instances over those years where he experienced a loss in consciousness following a blow. He described numerous times where he also got his "bell rung," including one example where he entered the huddle afterwards and was not sure where he was. 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 He and his  significant other expressed  concerns surrounding the long-term implications of these experiences. No more recent head injuries were described.  History of stroke: Denied. History of seizure activity: Denied. History of known exposure to toxins: Denied. Symptoms of chronic pain: Denied. Experience of frequent headaches/migraines: Denied. Frequent instances of dizziness/vertigo: Denied.  Sensory changes: He reported some mild hearing loss. Other sensory changes/difficulties (e.g., vision, taste, smell) were denied.  Balance/coordination difficulties: Denied. Other motor difficulties: Denied.  Sleep History: Estimated hours obtained each night: 8 hours.  Difficulties falling asleep: Denied. Difficulties staying asleep: Endorsed. He reported waking frequently throughout the night to use the restroom.  Feels rested and refreshed upon awakening: Variably so depending on the quantity and quality of sleep obtained the night before.   History of snoring: Endorsed. History of waking up gasping for air: Endorsed. Witnessed breath cessation while asleep: Endorsed. He has a history of obstructive sleep apnea. His significant other estimated that he has not used his CPAP machine over the past four years. She expressed some interest in the [place] device.   History of vivid dreaming: Endorsed. Excessive movement while asleep: Endorsed. His significant other noted that physical movements while asleep are infrequent and generally mild in nature.  Instances of acting out his dreams: Denied.  Psychiatric/Behavioral Health History: Depression: He described his current mood as "okay" and denied to his knowledge any prior mental health concerns or formal diagnoses. Current or remote suicidal ideation, intent, or plan was denied.  Anxiety: Denied. Mania: Denied. Trauma History: Denied. Visual/auditory hallucinations: Denied. Delusional thoughts: Denied.  Tobacco: Denied. Alcohol: Currently, he reported consuming around five beers  daily. His significant other noted that these are commonly "tall boys" and that this may be an underestimation. When meeting with [name] in October 2024, he reported consuming eight beers daily and had described himself as a "functional alcoholic." Recreational drugs: Denied.  Family History: History reviewed. No pertinent family history. This information was confirmed by [name]. Bonn.  Academic/Vocational History: Highest level of educational attainment: 18 years. He earned a Oncologist in education from [employer] and a Manufacturing engineer in education administration from [employer]. He described himself as a good (A/B) student in academic settings. Foreign language represented a relative weakness in earlier academic settings.  History of developmental delay: Denied. History of grade repetition: Denied. Enrollment in special education courses: Denied. History of LD/ADHD: Denied.  Employment: Retired. He previously worked as a Museum/gallery curator.   Evaluation Results:   Behavioral Observations: [name] was accompanied by his significant other, arrived to his appointment on time, and was appropriately dressed and groomed. He appeared alert. Observed gait and station were within normal limits. Gross motor functioning appeared intact upon informal observation and no abnormal movements (e.g., tremors) were noted. His affect was generally relaxed and positive. Spontaneous speech was fluent and word finding difficulties were not observed during the clinical interview. Thought processes were coherent, organized, and normal in content. Insight into his cognitive difficulties appeared poor as he diminished or denied ongoing cognitive dysfunction despite objective testing suggesting severe impairments.   During testing, sustained attention was appropriate. Task engagement was adequate and he persisted when challenged. Overall, [name] was cooperative with the clinical interview and  subsequent testing procedures.   Adequacy of Effort: The validity of neuropsychological testing is limited by the extent to which the individual being tested may be assumed to have exerted adequate effort during testing. [name]. Taney expressed his intention to perform to the best of his abilities  and exhibited adequate task engagement and persistence. Scores across stand-alone and embedded performance validity measures were variable. His sole below expectation performance did approach appropriate clinical cut-offs. I do not have concern for poor engagement or attempts to perform poorly across the current evaluation and know of no secondary gain for diminished performances. Overall, while mild caution surrounding test interpretation may be prudent, I do feel that the results of the current evaluation are a reasonably valid representation of [name]. Janik' current cognitive functioning.  Test Results: [name]. Janusz was mildly disoriented at the time of the current evaluation. He incorrectly stated the current month ("February") and was unable to provide an accurate phone number.  Intellectual abilities based upon educational and vocational attainment were estimated to be in the average to above average range. Premorbid abilities were estimated to be within the average range based upon a single-word reading test.   Processing speed was well below average. Basic attention was average. More complex attention (e.g., working memory) was below average. Executive functioning was exceptionally low to below average.  While not directly assessed, receptive language abilities were believed to be appropriate. [name]. Seth did not exhibit any difficulties comprehending task instructions and answered all questions asked of him appropriately. Assessed expressive language (e.g., verbal fluency and confrontation naming) was variable. Phonemic fluency was below average to average, semantic fluency was exceptionally low to well below  average, and confrontation naming was average.     Assessed visuospatial/visuoconstructional abilities were largely below average to average. His copy of a complex figure was well below average with points being lost due to several mild visual distortions and one external aspect being omitted entirely.     Learning (i.e., encoding) of novel verbal and visual information was exceptionally low. Spontaneous delayed recall (i.e., retrieval) of previously learned information was exceptionally low to below average. Retention rates were 0% across a list learning task, 0% across a story learning task, and 11% across a daily living task. While retention rates were appropriate across a shape learning task, he indicated that he was guessing across this forced choice trial and likely did not exhibit true recollection. Performance across recognition tasks was exceptionally low to well below average, suggesting negligible evidence for information consolidation.   Results of emotional screening instruments suggested that recent symptoms of generalized anxiety were in the minimal range, while symptoms of depression were within normal limits. A screening instrument assessing recent sleep quality suggested the presence of minimal sleep dysfunction.  Tables of Scores:   Note: This summary of test scores accompanies the interpretive report and should not be considered in isolation without reference to the appropriate sections in the text. Descriptors are based on appropriate normative data and may be adjusted based on clinical judgment. Terms such as "Within Normal Limits" and "Outside Normal Limits" are used when a more specific description of the test score cannot be determined.       Percentile - Normative Descriptor > 98 - Exceptionally High 91-97 - Well Above Average 75-90 - Above Average 25-74 - Average 9-24 - Below Average 2-8 - Well Below Average < 2 - Exceptionally Low       Validity:   DESCRIPTOR        ACS WC: --- --- Outside Normal Limits  DCT: --- --- Within Normal Limits  NAB EVI: --- --- Within Normal Limits       Orientation:      Raw Score Percentile   NAB Orientation, Form 1 26/29 --- ---  Cognitive Screening:      Raw Score Percentile   SLUMS: 20/30 --- ---       Intellectual Functioning:      Standard Score Percentile   Test of Premorbid Functioning: 101 53 Average       Memory:     NAB Memory Module, Form 1: Standard Score/ T Score Percentile   Total Memory Index 51 <1 Exceptionally Low  List Learning       Total Trials 1-3 9/36 (19) <1 Exceptionally Low    List B 2/12 (33) 5 Well Below Average    Short Delay Free Recall 0/12 (19) <1 Exceptionally Low    Long Delay Free Recall 0/12 (21) <1 Exceptionally Low    Retention Percentage 0 (<19) <1 Exceptionally Low    Recognition Discriminability -4 (<19) <1 Exceptionally Low  Shape Learning       Total Trials 1-3 8/27 (27) 1 Exceptionally Low    Delayed Recall 4/9 (38) 12 Below Average    Retention Percentage 100 (51) 54 Average    Recognition Discriminability 3 (32) 4 Well Below Average  Story Learning       Immediate Recall 25/80 (23) <1 Exceptionally Low    Delayed Recall 0/40 (27) 1 Exceptionally Low    Retention Percentage 0 (<19) <1 Exceptionally Low  Daily Living Memory       Immediate Recall 21/51 (20) <1 Exceptionally Low    Delayed Recall 1/17 (19) <1 Exceptionally Low    Retention Percentage 11 (<19) <1 Exceptionally Low    Recognition Hits 4/10 (<19) <1 Exceptionally Low       Attention/Executive Function:     Trail Making Test (TMT): Raw Score (T Score) Percentile     Part A 49 secs.,  0 errors (35) 7 Well Below Average    Part B 300 secs.,  5 errors (<19) <1 Exceptionally Low         Scaled Score Percentile   WAIS-IV Coding: 4 2 Well Below Average       NAB Attention Module, Form 1: T Score Percentile     Digits Forward 44 27 Average    Digits Backwards 39 14 Below Average         Scaled Score Percentile   WAIS-IV Similarities: 7 16 Below Average       D-KEFS Verbal Fluency Test: Raw Score (Scaled Score) Percentile     Letter Total Correct 37 (10) 50 Average    Category Total Correct 20 (4) 2 Well Below Average    Category Switching Total Correct 6 (2) <1 Exceptionally Low    Category Switching Accuracy 2 (1) <1 Exceptionally Low      Total Set Loss Errors 3 (9) 37 Average      Total Repetition Errors 5 (8) 25 Average       Language:     Verbal Fluency Test: Raw Score (T Score) Percentile     Phonemic Fluency (FAS) 37 (42) 21 Below Average    Animal Fluency 11 (26) 1 Exceptionally Low        NAB Language Module, Form 1: T Score Percentile     Naming 31/31 (55) 69 Average       Visuospatial/Visuoconstruction:      Raw Score Percentile   Clock Drawing: 8/10 --- Within Normal Limits       NAB Spatial Module, Form 1: T Score Percentile     Figure Drawing Copy 36 8 Well Below Average  Scaled Score Percentile   WAIS-IV Block Design: 8 25 Average  WAIS-IV Matrix Reasoning: 6 9 Below Average  WAIS-IV Visual Puzzles: 8 25 Average       Mood and Personality:      Raw Score Percentile   Beck Depression Inventory - II: 1 --- Within Normal Limits  PROMIS Anxiety Questionnaire: 9 --- None to Slight       Additional Questionnaires:      Raw Score Percentile   PROMIS Sleep Disturbance Questionnaire: 20 --- None to Slight   Informed Consent and Coding/Compliance:   The current evaluation represents a clinical evaluation for the purposes previously outlined by the referral source and is in no way reflective of a forensic evaluation.   [name]. Smalls was provided with a verbal description of the nature and purpose of the present neuropsychological evaluation. Also reviewed were the foreseeable risks and/or discomforts and benefits of the procedure, limits of confidentiality, and mandatory reporting requirements of this provider. The patient was given the  opportunity to ask questions and receive answers about the evaluation. Oral consent to participate was provided by the patient.   This evaluation was conducted by Newman Nickels, Ph.D., ABPP-CN, board certified clinical neuropsychologist. [name]. Liew completed a clinical interview with Dr. Milbert Coulter, billed as one unit [phone], and 115 minutes of cognitive testing and scoring, billed as one unit [phone] and three additional units [id number]. Psychometrist Shan Levans, B.S. assisted Dr. Milbert Coulter with test administration and scoring procedures. As a separate and discrete service, one unit [id number] and two units [phone] were billed for Dr. Tammy Sours time spent in interpretation and report writing.

## 2023-02-15 ENCOUNTER — Ambulatory Visit: Payer: Medicare PPO | Admitting: Psychology

## 2023-02-15 DIAGNOSIS — Z87820 Personal history of traumatic brain injury: Secondary | ICD-10-CM | POA: Diagnosis not present

## 2023-02-15 DIAGNOSIS — G3184 Mild cognitive impairment, so stated: Secondary | ICD-10-CM

## 2023-02-15 DIAGNOSIS — F1090 Alcohol use, unspecified, uncomplicated: Secondary | ICD-10-CM | POA: Diagnosis not present

## 2023-02-15 NOTE — Progress Notes (Signed)
   Neuropsychology Feedback Session [name]. [facility] [place] Department of Neurology  Reason for Referral:   [name] is a 68 y.o. right-handed Caucasian male referred by Camie Sevin, PA-C, to characterize his current cognitive functioning and assist with diagnostic clarity and treatment planning in the context of subjective cognitive decline.   Feedback:   [name] completed a comprehensive neuropsychological evaluation on 02/08/2023. Please refer to that encounter for the full report and recommendations. Briefly, results suggested diffuse cognitive dysfunction. Severe impairment was exhibited across all aspects of learning and memory. Additional impairments were exhibited across processing speed, executive functioning, and semantic fluency. Some variability was further exhibited across visuospatial tasks; however, all but one of these tasks was normatively appropriate. The etiology for ongoing cognitive impairment is unclear. Notably, diagnostic clarity is significantly hampered by ongoing alcohol abuse/dependence. Notes in his medical record as recent as this past October suggest the consumption of eight beers daily. Currently, he indicated consumption of five beers daily with his significant other separately suggesting that this was likely an underestimation. This degree of alcohol abuse certainly has the potential to dramatically impact memory and other cognitive abilities. There remains the potential that this represents the primary source for ongoing impairment and he is certainly at an elevated risk for an alcohol-related dementia presentation in the future. These difficulties would be further exacerbated by untreated sleep apnea. Neurologically speaking, I cannot rule out the presence of illness such as Alzheimer's disease. However, as stated above, symptom overlap between Alzheimer's disease and an alcohol-related dementia presentation is quite significant as amnestic  memory patterns can be seen in both cases.   [name] was accompanied by his wife during the current feedback session. Content of the current session focused on the results of his neuropsychological evaluation. [name] was given the opportunity to ask questions and his questions were answered. He was encouraged to reach out should additional questions arise. A copy of his report was provided at the conclusion of the visit.      One unit [phone] was billed for Dr. Loralee time spent preparing for, conducting, and documenting the current feedback session with Mr. Eley.

## 2023-02-18 ENCOUNTER — Ambulatory Visit
Admission: RE | Admit: 2023-02-18 | Discharge: 2023-02-18 | Disposition: A | Discharge status: Home / Self Care | Payer: Medicare PPO | Source: Ambulatory Visit | Attending: Physician Assistant | Admitting: Physician Assistant

## 2023-03-09 ENCOUNTER — Other Ambulatory Visit: Payer: Medicare PPO

## 2023-03-09 ENCOUNTER — Encounter: Payer: Self-pay | Admitting: Physician Assistant

## 2023-03-09 ENCOUNTER — Ambulatory Visit: Payer: Medicare PPO | Admitting: Physician Assistant

## 2023-03-09 VITALS — BP 103/70 | Resp 20 | Ht 72.0 in | Wt 229.0 lb

## 2023-03-09 DIAGNOSIS — F1090 Alcohol use, unspecified, uncomplicated: Secondary | ICD-10-CM | POA: Diagnosis not present

## 2023-03-09 DIAGNOSIS — G3184 Mild cognitive impairment, so stated: Secondary | ICD-10-CM

## 2023-03-09 DIAGNOSIS — R413 Other amnesia: Secondary | ICD-10-CM | POA: Insufficient documentation

## 2023-03-09 MED ORDER — DONEPEZIL HCL 10 MG PO TABS: ORAL_TABLET | ORAL | 11 refills | Status: DC

## 2023-03-09 NOTE — Progress Notes (Signed)
 Assessment/Plan:   Mild Cognitive Impairment with memory loss, concern for Alzheimer's disease History of alcohol abuse  [name] is a very pleasant 68 y.o. RH male hypertension, hyperlipidemia, OSA noncompliant with CPAP, vitamin D deficiency, history of prostate cancer, B12 deficiency, fatty liver, prediabetes, multiple TBIs, alcohol habituation and a diagnosis of mild neurocognitive disorder, currently of unclear etiology but with concerns for Alzheimer's disease and a component of alcohol abuse per neuropsych evaluation 02/08/2023.  Seen today in follow up to discuss the MRI of the brain results and recent evaluation by neuropsych.  Last MoCA on October 2024 was 21/30. MRI of the brain from 03/03/2023, personally reviewed was remarkable for mild parenchymal volume loss without lobar specific atrophy, and mild volume loss of the bilateral hippocampi, mild chronic microvascular ischemic changes, no acute intracranial abnormalities.  Cerebellum was unremarkable.He drinks non alcoholic beverages, now realizing that real beer Last night he drank 3-4  and one regular, helps the craving.   This patient is accompanied in the office by his wife who supplements the history.    Follow up in 6  months. Start donepezil 10 mg daily, side effects discussed.    Plasma markers for possible AD Continue to control mood as per PCP Recommend good control of cardiovascular risk factors Recommend resuming using CPAP for OSA Continue alcohol cessation.  Initial visit October 2024 How long did patient have memory difficulties? For the last 6 to 8 years per wife's report, worse over the last year.  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 She discussed her observations with other wives 4 of them out of 7 noticed that their husbands were showing same cognitive and behavioral changes, but did not seek medical attention. According to her, he becomes  distracted, has loss of attention.  At times, he becomes very agitated and seems confused and forgetful, needs repeated instructions. Reports some difficulty remembering new information, conversations and names.  Long-term memory is good. 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 He has a daily routine, but if out of his routine, he asks "what are we doing next?". He likes to read the paper.  repeats oneself?  Endorsed Disoriented when walking into a room?  Patient denies   Leaving objects in unusual places? Denies.   Wandering behavior?  denies .  Any personality changes?   " He is quick to anger and has become impatient" "I started seeing it in 2017"-wife says.  Any history of depression?:  Denies   Hallucinations or paranoia?  Denies   Seizures?  Denies    Any sleep changes? Sleeps well with the CPAP but has not been using it.  "He does dream more than usual " Denies REM behavior or sleepwalking   Sleep apnea? Endorsed Any hygiene concerns?  Denies   Independent of bathing and dressing?  Endorsed  Does the patient needs help with medications? Patient is in charge   Who is in charge of the finances? Patient is in charge     Any changes in appetite? Eating well.  Patient have trouble swallowing? Denies.   Does the patient cook? Yes, denies any kitchen accidents.  Any kitchen accidents such as leaving the stove on? Denies.   Any history of headaches?  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   Chronic pain ? Denies.  Mostly arthritic in hips and knees Ambulates with difficulty?  Denies.   Recent  falls or head injuries?  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 Vision changes? Needs glasses to read.   Unilateral weakness, numbness or tingling? Denies.   Any tremors?   Denies.   Any anosmia?  Denies.   Any incontinence of urine? Endorsed, urge incontinence.gets up 3-4 times to urinate.  Any bowel dysfunction? Denies.       Patient lives with wife  History of heavy alcohol intake?  Endorsed, 8 12 oz beers a day.He has done so for many decades, he considers himself a functional alcoholic.  History of heavy tobacco use?  Never smoked Family history of dementia? Denies.  Does patient drive? Yes, he got lost on 2 occasions.  Retired Museum/gallery curator, Runner, broadcasting/film/video, Charity fundraiser. Retired in 2013    Pertinent labs October 2024: Normal lipid panel, B12 560, vitamin D48.9, PSA 0.01, A1c 5.3,  Neuropsychological evaluation 02/08/2023 briefly, results suggested diffuse cognitive dysfunction. Severe impairment was exhibited across all aspects of learning and memory. Additional impairments were exhibited across processing speed, executive functioning, and semantic fluency. Some variability was further exhibited across visuospatial tasks; however, all but one of these tasks was normatively appropriate. The etiology for ongoing cognitive impairment is unclear. Notably, diagnostic clarity is significantly hampered by ongoing alcohol abuse/dependence. Notes in his medical record as recent as this past October suggest the consumption of eight beers daily. Currently, he indicated consumption of five beers daily with his significant other separately suggesting that this was likely an underestimation. This degree of alcohol abuse certainly has the potential to dramatically impact memory and other cognitive abilities. There remains the potential that this represents the primary source for ongoing impairment and he is certainly at an elevated risk for an alcohol-related dementia presentation in the future. These difficulties would be further exacerbated by untreated sleep apnea. Neurologically speaking, I cannot rule out the presence of illness such as Alzheimer's disease. However, as stated above, symptom overlap between Alzheimer's disease and an alcohol-related dementia presentation is quite significant as amnestic memory patterns can be seen in  both cases.     CURRENT MEDICATIONS:  Outpatient Encounter Medications as of 03/09/2023  Medication Sig   aspirin EC 325 MG EC tablet Take 1 tablet (325 mg total) by mouth 2 (two) times daily.   Cholecalciferol (VITAMIN D) 2000 UNITS CAPS Take 1 capsule by mouth daily.    Cyanocobalamin (VITAMIN B-12 IJ) Inject 1,000 mg as directed every 30 (thirty) days.   fish oil-omega-3 fatty acids 1000 MG capsule Take 1 g by mouth daily.  (Patient not taking: Reported on 11/11/2022)   glucosamine-chondroitin 500-400 MG tablet Take 1 tablet by mouth 2 (two) times daily. (Patient not taking: Reported on 11/11/2022)   Multiple Vitamins-Minerals (MULTIVITAMINS THER. W/MINERALS) TABS Take 1 tablet by mouth daily.    Probiotic Product (PHILLIPS COLON HEALTH PO) Take 1 capsule by mouth daily as needed. constipation (Patient not taking: Reported on 11/11/2022)   rosuvastatin (CRESTOR) 10 MG tablet    valsartan-hydrochlorothiazide (DIOVAN-HCT) 160-25 MG per tablet Take 1 tablet by mouth daily with breakfast.    No facility-administered encounter medications on file as of 03/09/2023.        No data to display            11/11/2022    1:00 PM  Montreal Cognitive Assessment   Visuospatial/ Executive (0/5) 3  Naming (0/3) 3  Attention: Read list of digits (0/2) 2  Attention: Read list of letters (0/1) 1  Attention: Serial 7 subtraction starting at 100 (0/3) 1  Language: Repeat phrase (0/2) 2  Language : Fluency (0/1) 1  Abstraction (0/2) 2  Delayed Recall (0/5) 0  Orientation (0/6) 6  Total 21  Adjusted Score (based on education) 21   Thank you for allowing [place] the opportunity to participate in the care of this nice patient. Please do not hesitate to contact us for any questions or concerns.   Total time spent on today's visit was 40 minutes dedicated to this patient today, preparing to see patient, examining the patient, ordering tests and/or medications and counseling the patient, documenting  clinical information in the EHR or other health record, independently interpreting results and communicating results to the patient/family, discussing treatment and goals, answering patient's questions and coordinating care.  Cc:  Debroah Loop, DO  [facility] 03/09/2023 6:29 AM

## 2023-03-09 NOTE — Patient Instructions (Addendum)
 It was a pleasure to see you today at our office.   Recommendations:   Follow up in 6  months Labs today Discontinue alcohol intake  Consider anxiety pill while on withdrawal   Start Donepezil half tablet (5mg ) daily for 2  weeks.  If you are tolerating the medication, then after 2 weeks, we will increase the dose to a full tablet of 10 mg daily.    Follow up sleep apnea, CPAP issues     For psychiatric meds, mood meds: Please have your primary care physician manage these medications.  If you have any severe symptoms of a stroke, or other severe issues such as confusion,severe chills or fever, etc call 911 or go to the ER as you may need to be evaluated further     For assessment of decision of mental capacity and competency:  Call Dr. Erick Blinks, geriatric psychiatrist at [phone]  Counseling regarding caregiver distress, including caregiver depression, anxiety and issues regarding community resources, adult day care programs, adult living facilities, or memory care questions:  please contact your  Primary Doctor's Social Worker   Whom to call: Memory  decline, memory medications: Call our office [phone]    [email or web address]   RECOMMENDATIONS FOR ALL PATIENTS WITH MEMORY PROBLEMS: 1. Continue to exercise (Recommend 30 minutes of walking everyday, or 3 hours every week) 2. Increase social interactions - continue going to [place] and enjoy social gatherings with friends and family 3. Eat healthy, avoid fried foods and eat more fruits and vegetables 4. Maintain adequate blood pressure, blood sugar, and blood cholesterol level. Reducing the risk of stroke and cardiovascular disease also helps promoting better memory. 5. Avoid stressful situations. Live a simple life and avoid aggravations. Organize your time and prepare for the next day in anticipation. 6. Sleep well, avoid any interruptions of sleep and avoid  any distractions in the bedroom that may interfere with adequate sleep quality 7. Avoid sugar, avoid sweets as there is a strong link between excessive sugar intake, diabetes, and cognitive impairment We discussed the Mediterranean diet, which has been shown to help patients reduce the risk of progressive memory disorders and reduces cardiovascular risk. This includes eating fish, eat fruits and green leafy vegetables, nuts like almonds and hazelnuts, walnuts, and also use olive oil. Avoid fast foods and fried foods as much as possible. Avoid sweets and sugar as sugar use has been linked to worsening of memory function.  There is always a concern of gradual progression of memory problems. If this is the case, then we may need to adjust level of care according to patient needs. Support, both to the patient and caregiver, should then be put into place.      You have been referred for a neuropsychological evaluation (i.e., evaluation of memory and thinking abilities). Please bring someone with you to this appointment if possible, as it is helpful for the doctor to hear from both you and another adult who knows you well. Please bring eyeglasses and hearing aids if you wear them.    The evaluation will take approximately 3 hours and has two parts:   The first part is a clinical interview with the neuropsychologist (Dr. Milbert Coulter or Dr. Roseanne Reno). During the interview, the neuropsychologist will speak with you and the individual you brought to the appointment.    The second part of the evaluation is testing with the doctor's technician Annabelle Harman or Selena Batten). During the testing, the technician will ask you to remember different types  of material, solve problems, and answer some questionnaires. Your family member will not be present for this portion of the evaluation.   Please note: We must reserve several hours of the neuropsychologist's time and the psychometrician's time for your evaluation appointment. As such, there  is a No-Show fee of $100. If you are unable to attend any of your appointments, please contact our office as soon as possible to reschedule.      DRIVING: Regarding driving, in patients with progressive memory problems, driving will be impaired. We advise to have someone else do the driving if trouble finding directions or if minor accidents are reported. Independent driving assessment is available to determine safety of driving.   If you are interested in the driving assessment, you can contact the following:  [facility] [employer] [facility] in [place] [phone]  [facility] [phone]  [facility] [phone]  [facility] [phone] or [phone]   FALL PRECAUTIONS: Be cautious when walking. Scan the area for obstacles that may increase the risk of trips and falls. When getting up in the mornings, sit up at the edge of the bed for a few minutes before getting out of bed. Consider elevating the bed at the head end to avoid drop of blood pressure when getting up. Walk always in a well-lit room (use night lights in the walls). Avoid area rugs or power cords from appliances in the middle of the walkways. Use a walker or a cane if necessary and consider physical therapy for balance exercise. Get your eyesight checked regularly.  FINANCIAL OVERSIGHT: Supervision, especially oversight when making financial decisions or transactions is also recommended.  HOME SAFETY: Consider the safety of the kitchen when operating appliances like stoves, microwave oven, and blender. Consider having supervision and share cooking responsibilities until no longer able to participate in those. Accidents with firearms and other hazards in the house should be identified and addressed as well.   ABILITY TO BE LEFT ALONE: If patient is unable to contact 911 operator, consider using LifeLine, or when the need is there, arrange for someone to stay with patients. Smoking is a fire  hazard, consider supervision or cessation. Risk of wandering should be assessed by caregiver and if detected at any point, supervision and safe proof recommendations should be instituted.  MEDICATION SUPERVISION: Inability to self-administer medication needs to be constantly addressed. Implement a mechanism to ensure safe administration of the medications.      Mediterranean Diet A Mediterranean diet refers to food and lifestyle choices that are based on the traditions of countries located on the [place]. This way of eating has been shown to help prevent certain conditions and improve outcomes for people who have chronic diseases, like kidney disease and heart disease. What are tips for following this plan? Lifestyle  Cook and eat meals together with your family, when possible. Drink enough fluid to keep your urine clear or pale yellow. Be physically active every day. This includes: Aerobic exercise like running or swimming. Leisure activities like gardening, walking, or housework. Get 7-8 hours of sleep each night. If recommended by your health care provider, drink red wine in moderation. This means 1 glass a day for nonpregnant women and 2 glasses a day for men. A glass of wine equals 5 oz (150 mL). Reading food labels  Check the serving size of packaged foods. For foods such as rice and pasta, the serving size refers to the amount of cooked product, not dry. Check the total fat in  packaged foods. Avoid foods that have saturated fat or trans fats. Check the ingredients list for added sugars, such as corn syrup. Shopping  At the grocery store, buy most of your food from the areas near the walls of the store. This includes: Fresh fruits and vegetables (produce). Grains, beans, nuts, and seeds. Some of these may be available in unpackaged forms or large amounts (in bulk). Fresh seafood. Poultry and eggs. Low-fat dairy products. Buy whole ingredients instead of prepackaged  foods. Buy fresh fruits and vegetables in-season from local farmers markets. Buy frozen fruits and vegetables in resealable bags. If you do not have access to quality fresh seafood, buy precooked frozen shrimp or canned fish, such as tuna, salmon, or sardines. Buy small amounts of raw or cooked vegetables, salads, or olives from the deli or salad bar at your store. Stock your pantry so you always have certain foods on hand, such as olive oil, canned tuna, canned tomatoes, rice, pasta, and beans. Cooking  Cook foods with extra-virgin olive oil instead of using butter or other vegetable oils. Have meat as a side dish, and have vegetables or grains as your main dish. This means having meat in small portions or adding small amounts of meat to foods like pasta or stew. Use beans or vegetables instead of meat in common dishes like chili or lasagna. Experiment with different cooking methods. Try roasting or broiling vegetables instead of steaming or sauteing them. Add frozen vegetables to soups, stews, pasta, or rice. Add nuts or seeds for added healthy fat at each meal. You can add these to yogurt, salads, or vegetable dishes. Marinate fish or vegetables using olive oil, lemon juice, garlic, and fresh herbs. Meal planning  Plan to eat 1 vegetarian meal one day each week. Try to work up to 2 vegetarian meals, if possible. Eat seafood 2 or more times a week. Have healthy snacks readily available, such as: Vegetable sticks with hummus. Greek yogurt. Fruit and nut trail mix. Eat balanced meals throughout the week. This includes: Fruit: 2-3 servings a day Vegetables: 4-5 servings a day Low-fat dairy: 2 servings a day Fish, poultry, or lean meat: 1 serving a day Beans and legumes: 2 or more servings a week Nuts and seeds: 1-2 servings a day Whole grains: 6-8 servings a day Extra-virgin olive oil: 3-4 servings a day Limit red meat and sweets to only a few servings a month What are my food  choices? Mediterranean diet Recommended Grains: Whole-grain pasta. Brown rice. Bulgar wheat. Polenta. Couscous. Whole-wheat bread. Orpah Cobb. Vegetables: Artichokes. Beets. Broccoli. Cabbage. Carrots. Eggplant. Green beans. Chard. Kale. Spinach. Onions. Leeks. Peas. Squash. Tomatoes. Peppers. Radishes. Fruits: Apples. Apricots. Avocado. Berries. Bananas. Cherries. Dates. Figs. Grapes. Lemons. Melon. Oranges. Peaches. Plums. Pomegranate. Meats and other protein foods: Beans. Almonds. Sunflower seeds. Pine nuts. Peanuts. Cod. Salmon. Scallops. Shrimp. Tuna. Tilapia. Clams. Oysters. Eggs. Dairy: Low-fat milk. Cheese. Greek yogurt. Beverages: Water. Red wine. Herbal tea. Fats and oils: Extra virgin olive oil. Avocado oil. Grape seed oil. Sweets and desserts: [place] yogurt with honey. Baked apples. Poached pears. Trail mix. Seasoning and other foods: Basil. Cilantro. Coriander. Cumin. Mint. Parsley. Sage. Rosemary. Tarragon. Garlic. Oregano. Thyme. Pepper. Balsalmic vinegar. Tahini. Hummus. Tomato sauce. Olives. Mushrooms. Limit these Grains: Prepackaged pasta or rice dishes. Prepackaged cereal with added sugar. Vegetables: Deep fried potatoes (french fries). Fruits: Fruit canned in syrup. Meats and other protein foods: Beef. Pork. Lamb. Poultry with skin. Hot dogs. Tomasa Blase. Dairy: Ice cream. Sour cream. Whole milk. Beverages:  Juice. Sugar-sweetened soft drinks. Beer. Liquor and spirits. Fats and oils: Butter. Canola oil. Vegetable oil. Beef fat (tallow). Lard. Sweets and desserts: Cookies. Cakes. Pies. Candy. Seasoning and other foods: Mayonnaise. Premade sauces and marinades. The items listed may not be a complete list. Talk with your dietitian about what dietary choices are right for you. Summary The Mediterranean diet includes both food and lifestyle choices. Eat a variety of fresh fruits and vegetables, beans, nuts, seeds, and whole grains. Limit the amount of red meat and sweets that  you eat. Talk with your health care provider about whether it is safe for you to drink red wine in moderation. This means 1 glass a day for nonpregnant women and 2 glasses a day for men. A glass of wine equals 5 oz (150 mL). This information is not intended to replace advice given to you by your health care provider. Make sure you discuss any questions you have with your health care provider. Document Released: 08/21/2015 Document Revised: 09/23/2015 Document Reviewed: 08/21/2015 Elsevier Interactive Patient Education  2017 [employer].    [address]

## 2023-03-21 LAB — QUEST AD-DETECT® PHOSPHORYLATED TAU181(P-TAU181), PLASMA: QUEST AD DETECT PTAU181, PLASMA: 0.8 pg/mL (ref <=1.07)

## 2023-03-21 LAB — ADMARK® APOE GENOTYPE ANALYSIS AND INTERPRETATION SYMPTOMATIC

## 2023-03-21 LAB — QUEST AD-DETECT™, BETA-AMYLOID 42/40 RATIO, PLASMA
ABETA 40: 297 pg/mL
ABETA 42/40 RATIO: 0.172 (ref >=0.170)
ABETA 42: 51 pg/mL

## 2023-03-21 LAB — QUEST AD-DETECT PHOSPHORYLATED TAU217(P-TAU217), PLASMA: Quest Detect PTAU217, Plasma: 0.15 pg/mL (ref <=0.15)

## 2023-03-22 ENCOUNTER — Telehealth: Payer: Self-pay | Admitting: Physician Assistant

## 2023-03-22 NOTE — Telephone Encounter (Signed)
 Pt's wife called in and left a message. She wants to get the results of the blood test. She cannot get into mychart to see it.

## 2023-03-22 NOTE — Telephone Encounter (Signed)
 I advised of results, voiced understanding and thanked [place] for calling.

## 2023-03-22 NOTE — Progress Notes (Signed)
 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  Etiology of memory loss is unclear, but alcohol may have played a role.For now continue on Aricept until next visit. Thanks

## 2023-05-06 ENCOUNTER — Encounter: Payer: Self-pay | Admitting: Physician Assistant

## 2023-05-09 ENCOUNTER — Ambulatory Visit: Payer: Medicare PPO | Admitting: Physician Assistant

## 2023-05-13 ENCOUNTER — Encounter: Payer: Self-pay | Admitting: Physician Assistant

## 2023-05-13 ENCOUNTER — Ambulatory Visit: Admitting: Physician Assistant

## 2023-05-13 VITALS — BP 91/54 | HR 60 | Resp 20 | Ht 72.0 in

## 2023-05-13 DIAGNOSIS — G3184 Mild cognitive impairment, so stated: Secondary | ICD-10-CM

## 2023-05-13 MED ORDER — DONEPEZIL HCL 10 MG PO TABS: ORAL_TABLET | ORAL | 4 refills | Status: AC

## 2023-05-13 MED ORDER — DONEPEZIL HCL 10 MG PO TABS
ORAL_TABLET | ORAL | 3 refills | Status: DC
Start: 2023-05-13 — End: 2023-05-13

## 2023-05-13 NOTE — Patient Instructions (Signed)
 It was a pleasure to see you today at our office.   Recommendations:   Follow up May 6 at 11:30  Congratulations on quitting alcohol   Consider anxiety pill while on withdrawal  Continue Donepezil   10 mg daily.    Follow up sleep apnea, CPAP issues     For psychiatric meds, mood meds: Please have your primary care physician manage these medications.  If you have any severe symptoms of a stroke, or other severe issues such as confusion,severe chills or fever, etc call 911 or go to the ER as you may need to be evaluated further     For assessment of decision of mental capacity and competency:  Call Dr. Laverne Potter, geriatric psychiatrist at [phone]  Counseling regarding caregiver distress, including caregiver depression, anxiety and issues regarding community resources, adult day care programs, adult living facilities, or memory care questions:  please contact your  Primary Doctor's Social Worker   Whom to call: Memory  decline, memory medications: Call our office [phone]    [email or web address]   RECOMMENDATIONS FOR ALL PATIENTS WITH MEMORY PROBLEMS: 1. Continue to exercise (Recommend 30 minutes of walking everyday, or 3 hours every week) 2. Increase social interactions - continue going to [place] and enjoy social gatherings with friends and family 3. Eat healthy, avoid fried foods and eat more fruits and vegetables 4. Maintain adequate blood pressure, blood sugar, and blood cholesterol level. Reducing the risk of stroke and cardiovascular disease also helps promoting better memory. 5. Avoid stressful situations. Live a simple life and avoid aggravations. Organize your time and prepare for the next day in anticipation. 6. Sleep well, avoid any interruptions of sleep and avoid any distractions in the bedroom that may interfere with adequate sleep quality 7. Avoid sugar, avoid sweets as there is a strong link  between excessive sugar intake, diabetes, and cognitive impairment We discussed the Mediterranean diet, which has been shown to help patients reduce the risk of progressive memory disorders and reduces cardiovascular risk. This includes eating fish, eat fruits and green leafy vegetables, nuts like almonds and hazelnuts, walnuts, and also use olive oil. Avoid fast foods and fried foods as much as possible. Avoid sweets and sugar as sugar use has been linked to worsening of memory function.  There is always a concern of gradual progression of memory problems. If this is the case, then we may need to adjust level of care according to patient needs. Support, both to the patient and caregiver, should then be put into place.      You have been referred for a neuropsychological evaluation (i.e., evaluation of memory and thinking abilities). Please bring someone with you to this appointment if possible, as it is helpful for the doctor to hear from both you and another adult who knows you well. Please bring eyeglasses and hearing aids if you wear them.    The evaluation will take approximately 3 hours and has two parts:   The first part is a clinical interview with the neuropsychologist (Dr. Kitty Perkins or Dr. Annette Barters). During the interview, the neuropsychologist will speak with you and the individual you brought to the appointment.    The second part of the evaluation is testing with the doctor's technician Bernabe Brew or Burdette Carolin). During the testing, the technician will ask you to remember different types of material, solve problems, and answer some questionnaires. Your family member will not be present for this portion of the evaluation.   Please note: We must reserve  several hours of the neuropsychologist's time and the psychometrician's time for your evaluation appointment. As such, there is a No-Show fee of $100. If you are unable to attend any of your appointments, please contact our office as soon as possible to  reschedule.      DRIVING: Regarding driving, in patients with progressive memory problems, driving will be impaired. We advise to have someone else do the driving if trouble finding directions or if minor accidents are reported. Independent driving assessment is available to determine safety of driving.   If you are interested in the driving assessment, you can contact the following:  [facility] [employer] [facility] in [place] [phone]  [facility] [phone]  [facility] [phone]  [facility] [phone] or [phone]   FALL PRECAUTIONS: Be cautious when walking. Scan the area for obstacles that may increase the risk of trips and falls. When getting up in the mornings, sit up at the edge of the bed for a few minutes before getting out of bed. Consider elevating the bed at the head end to avoid drop of blood pressure when getting up. Walk always in a well-lit room (use night lights in the walls). Avoid area rugs or power cords from appliances in the middle of the walkways. Use a walker or a cane if necessary and consider physical therapy for balance exercise. Get your eyesight checked regularly.  FINANCIAL OVERSIGHT: Supervision, especially oversight when making financial decisions or transactions is also recommended.  HOME SAFETY: Consider the safety of the kitchen when operating appliances like stoves, microwave oven, and blender. Consider having supervision and share cooking responsibilities until no longer able to participate in those. Accidents with firearms and other hazards in the house should be identified and addressed as well.   ABILITY TO BE LEFT ALONE: If patient is unable to contact 911 operator, consider using LifeLine, or when the need is there, arrange for someone to stay with patients. Smoking is a fire hazard, consider supervision or cessation. Risk of wandering should be assessed by caregiver and if detected at any point,  supervision and safe proof recommendations should be instituted.  MEDICATION SUPERVISION: Inability to self-administer medication needs to be constantly addressed. Implement a mechanism to ensure safe administration of the medications.      Mediterranean Diet A Mediterranean diet refers to food and lifestyle choices that are based on the traditions of countries located on the [place]. This way of eating has been shown to help prevent certain conditions and improve outcomes for people who have chronic diseases, like kidney disease and heart disease. What are tips for following this plan? Lifestyle  Cook and eat meals together with your family, when possible. Drink enough fluid to keep your urine clear or pale yellow. Be physically active every day. This includes: Aerobic exercise like running or swimming. Leisure activities like gardening, walking, or housework. Get 7-8 hours of sleep each night. If recommended by your health care provider, drink red wine in moderation. This means 1 glass a day for nonpregnant women and 2 glasses a day for men. A glass of wine equals 5 oz (150 mL). Reading food labels  Check the serving size of packaged foods. For foods such as rice and pasta, the serving size refers to the amount of cooked product, not dry. Check the total fat in packaged foods. Avoid foods that have saturated fat or trans fats. Check the ingredients list for added sugars, such as corn syrup. Shopping  At [employer],  buy most of your food from the areas near the walls of the store. This includes: Fresh fruits and vegetables (produce). Grains, beans, nuts, and seeds. Some of these may be available in unpackaged forms or large amounts (in bulk). Fresh seafood. Poultry and eggs. Low-fat dairy products. Buy whole ingredients instead of prepackaged foods. Buy fresh fruits and vegetables in-season from local farmers markets. Buy frozen fruits and vegetables in resealable  bags. If you do not have access to quality fresh seafood, buy precooked frozen shrimp or canned fish, such as tuna, salmon, or sardines. Buy small amounts of raw or cooked vegetables, salads, or olives from the deli or salad bar at your store. Stock your pantry so you always have certain foods on hand, such as olive oil, canned tuna, canned tomatoes, rice, pasta, and beans. Cooking  Cook foods with extra-virgin olive oil instead of using butter or other vegetable oils. Have meat as a side dish, and have vegetables or grains as your main dish. This means having meat in small portions or adding small amounts of meat to foods like pasta or stew. Use beans or vegetables instead of meat in common dishes like chili or lasagna. Experiment with different cooking methods. Try roasting or broiling vegetables instead of steaming or sauteing them. Add frozen vegetables to soups, stews, pasta, or rice. Add nuts or seeds for added healthy fat at each meal. You can add these to yogurt, salads, or vegetable dishes. Marinate fish or vegetables using olive oil, lemon juice, garlic, and fresh herbs. Meal planning  Plan to eat 1 vegetarian meal one day each week. Try to work up to 2 vegetarian meals, if possible. Eat seafood 2 or more times a week. Have healthy snacks readily available, such as: Vegetable sticks with hummus. Greek yogurt. Fruit and nut trail mix. Eat balanced meals throughout the week. This includes: Fruit: 2-3 servings a day Vegetables: 4-5 servings a day Low-fat dairy: 2 servings a day Fish, poultry, or lean meat: 1 serving a day Beans and legumes: 2 or more servings a week Nuts and seeds: 1-2 servings a day Whole grains: 6-8 servings a day Extra-virgin olive oil: 3-4 servings a day Limit red meat and sweets to only a few servings a month What are my food choices? Mediterranean diet Recommended Grains: Whole-grain pasta. Brown rice. Bulgar wheat. Polenta. Couscous. Whole-wheat bread.  Dwyane Glad. Vegetables: Artichokes. Beets. Broccoli. Cabbage. Carrots. Eggplant. Green beans. Chard. Kale. Spinach. Onions. Leeks. Peas. Squash. Tomatoes. Peppers. Radishes. Fruits: Apples. Apricots. Avocado. Berries. Bananas. Cherries. Dates. Figs. Grapes. Lemons. Melon. Oranges. Peaches. Plums. Pomegranate. Meats and other protein foods: Beans. Almonds. Sunflower seeds. Pine nuts. Peanuts. Cod. Salmon. Scallops. Shrimp. Tuna. Tilapia. Clams. Oysters. Eggs. Dairy: Low-fat milk. Cheese. Greek yogurt. Beverages: Water. Red wine. Herbal tea. Fats and oils: Extra virgin olive oil. Avocado oil. Grape seed oil. Sweets and desserts: [place] yogurt with honey. Baked apples. Poached pears. Trail mix. Seasoning and other foods: Basil. Cilantro. Coriander. Cumin. Mint. Parsley. Sage. Rosemary. Tarragon. Garlic. Oregano. Thyme. Pepper. Balsalmic vinegar. Tahini. Hummus. Tomato sauce. Olives. Mushrooms. Limit these Grains: Prepackaged pasta or rice dishes. Prepackaged cereal with added sugar. Vegetables: Deep fried potatoes (french fries). Fruits: Fruit canned in syrup. Meats and other protein foods: Beef. Pork. Lamb. Poultry with skin. Hot dogs. Helene Loader. Dairy: Ice cream. Sour cream. Whole milk. Beverages: Juice. Sugar-sweetened soft drinks. Beer. Liquor and spirits. Fats and oils: Butter. Canola oil. Vegetable oil. Beef fat (tallow). Lard. Sweets and desserts: Cookies. Cakes. Pies. Candy. Seasoning  and other foods: Mayonnaise. Premade sauces and marinades. The items listed may not be a complete list. Talk with your dietitian about what dietary choices are right for you. Summary The Mediterranean diet includes both food and lifestyle choices. Eat a variety of fresh fruits and vegetables, beans, nuts, seeds, and whole grains. Limit the amount of red meat and sweets that you eat. Talk with your health care provider about whether it is safe for you to drink red wine in moderation. This means 1 glass a day  for nonpregnant women and 2 glasses a day for men. A glass of wine equals 5 oz (150 mL). This information is not intended to replace advice given to you by your health care provider. Make sure you discuss any questions you have with your health care provider. Document Released: 08/21/2015 Document Revised: 09/23/2015 Document Reviewed: 08/21/2015 Elsevier Interactive Patient Education  2017 [employer].    [address]

## 2023-05-13 NOTE — Progress Notes (Signed)
 Assessment/Plan:   Mild Cognitive Impairment   [name] is a very pleasant 68 y.o. 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 These were personally reviewed, remarkable for  Patient is currently on donepezil  10 mg daily.   This patient is accompanied in the office by  who supplements the history. He  no longer drinks alcohol, he quit "cold [place] " since early March. Since then he feels that his memory has  improved.  LTM is good. He is planning to increase physical activity, eat healthier and resume the CPAP for OSA. HE has some moments of anxiety that he is to discuss with his PCP    Follow up in 1 year Continue to control mood as per PCP Continue donepezil  10 mg daily, side effects discussed  Follow up sleep apnea, CPAP issues Recommend good control of cardiovascular risk factors   Neuropsych evaluation 02/08/23 Briefly, results suggested diffuse cognitive dysfunction. Severe impairment was exhibited across all aspects of learning and memory. Additional impairments were exhibited across processing speed, executive functioning, and semantic fluency. Some variability was further exhibited across visuospatial tasks; however, all but one of these tasks was normatively appropriate. The etiology for ongoing cognitive impairment is unclear. Notably, diagnostic clarity is significantly hampered by ongoing alcohol abuse/dependence. Notes in his medical record as recent as this past October suggest the consumption of eight beers daily. Currently, he indicated consumption of five beers daily with his significant other separately suggesting that this was likely an underestimation. This degree of alcohol abuse certainly has the potential to dramatically impact memory and other cognitive abilities. There remains the potential that this represents the primary source for ongoing  impairment and he is certainly at an elevated risk for an alcohol-related dementia presentation in the future. These difficulties would be further exacerbated by untreated sleep apnea. Neurologically speaking, I cannot rule out the presence of illness such as Alzheimer's disease. However, as stated above, symptom overlap between Alzheimer's disease and an alcohol-related dementia presentation is quite significant as amnestic memory patterns can be seen in both cases.   Plasma biomarkers: Negative results for Alzheimer's etiology  MRI brain 11/11/22 no acute brain abnormality, mild parenchymal volume loss without lobar specific atrophy, mild volume loss of the bilateral hyper canthi, mild chronic microvascular ischemic changes  Initial visit October 2024 How long did patient have memory difficulties? For the last 6 to 8 years per wife's report, worse over the last year.  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 She discussed her observations with other wives 4 of them out of 7 noticed that their husbands were showing same cognitive and behavioral changes, but did not seek medical attention. According to her, he becomes distracted, has loss of attention.  At times, he becomes very agitated and seems confused and forgetful, needs repeated instructions. Reports some difficulty remembering new information, conversations and names.  Long-term memory is good. 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 He has a daily routine, but if out of his routine, he asks "what are we doing next?". He likes to read the paper.  repeats oneself?  Endorsed Disoriented when walking into a room?  Patient denies   Leaving objects in unusual places? Denies.   Wandering behavior?  denies .  Any personality changes?   " He  is quick to anger and has become impatient" "I started seeing it in 2017"-wife says.  Any history of depression?:  Denies   Hallucinations  or paranoia?  Denies   Seizures?  Denies    Any sleep changes? Sleeps well with the CPAP but has not been using it.  "He does dream more than usual " Denies REM behavior or sleepwalking   Sleep apnea? Endorsed Any hygiene concerns?  Denies   Independent of bathing and dressing?  Endorsed  Does the patient needs help with medications? Patient is in charge   Who is in charge of the finances? Patient is in charge     Any changes in appetite? Eating well.  Patient have trouble swallowing? Denies.   Does the patient cook? Yes, denies any kitchen accidents.  Any kitchen accidents such as leaving the stove on? Denies.   Any history of headaches?  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   Chronic pain ? Denies.  Mostly arthritic in hips and knees Ambulates with difficulty?  Denies.   Recent falls or head injuries?  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 Vision changes? Needs glasses to read.   Unilateral weakness, numbness or tingling? Denies.   Any tremors?   Denies.   Any anosmia?  Denies.   Any incontinence of urine? Endorsed, urge incontinence.gets up 3-4 times to urinate.  Any bowel dysfunction? Denies.      Patient lives with wife  History of heavy alcohol intake?  Endorsed, 8 12 oz beers a day.He has done so for many decades, he considers himself a functional alcoholic.  History of heavy tobacco use?  Never smoked Family history of dementia? Denies.  Does patient drive? Yes, he got lost on 2 occasions.  Retired Museum/gallery curator, Runner, broadcasting/film/video, Charity fundraiser. Retired in 2013    CURRENT MEDICATIONS:  Outpatient Encounter Medications as of 05/13/2023  Medication Sig   aspirin  EC 325 MG EC tablet Take 1 tablet (325 mg total) by mouth 2 (two) times daily.   Cholecalciferol  (VITAMIN D ) 2000 UNITS CAPS Take 1 capsule by mouth daily.    Cyanocobalamin  (VITAMIN B-12 IJ) Inject 1,000 mg as directed every 30 (thirty) days.   donepezil   (ARICEPT ) 10 MG tablet Take half tablet (5 mg) daily for 2 weeks, then increase to the full tablet at 10 mg daily.   fish oil-omega-3 fatty acids 1000 MG capsule Take 1 g by mouth daily.   glucosamine-chondroitin 500-400 MG tablet Take 1 tablet by mouth 2 (two) times daily.   Multiple Vitamins-Minerals (MULTIVITAMINS THER. W/MINERALS) TABS Take 1 tablet by mouth daily.    Probiotic Product (PHILLIPS COLON HEALTH PO) Take 1 capsule by mouth daily as needed. constipation   rosuvastatin (CRESTOR) 10 MG tablet    valsartan -hydrochlorothiazide  (DIOVAN -HCT) 160-25 MG per tablet Take 1 tablet by mouth daily with breakfast.    No facility-administered encounter medications on file as of 05/13/2023.        No data to display            11/11/2022    1:00 PM  Montreal Cognitive Assessment   Visuospatial/ Executive (0/5) 3  Naming (0/3) 3  Attention: Read list of digits (0/2) 2  Attention: Read list of letters (0/1) 1  Attention: Serial 7 subtraction starting at 100 (0/3) 1  Language: Repeat phrase (0/2) 2  Language : Fluency (0/1) 1  Abstraction (0/2) 2  Delayed Recall (0/5) 0  Orientation (0/6) 6  Total 21  Adjusted Score (based on education) 21   Thank you for allowing us  the opportunity to participate in the care of this nice patient. Please do not hesitate to contact us  for any questions or concerns.   Total time spent on today's visit was 35 minutes dedicated to this patient today, preparing to see patient, examining the patient, ordering tests and/or medications and counseling the patient, documenting clinical information in the EHR or other health record, independently interpreting results and communicating results to the patient/family, discussing treatment and goals, answering patient's questions and coordinating care.  Cc:  Elida Grounds, DO  [facility] 05/13/2023 6:45 AM

## 2023-06-10 ENCOUNTER — Telehealth: Payer: Self-pay | Admitting: Physician Assistant

## 2023-06-10 NOTE — Telephone Encounter (Signed)
 Pt's wife called in confused. She says she has been told verbally a different way to take the donepezil  than is written on the prescription. She wants to clarify

## 2023-06-10 NOTE — Telephone Encounter (Signed)
 Left a VM stating that she was returning a call about patients medication

## 2023-06-10 NOTE — Telephone Encounter (Signed)
 My chart message sent tried to call.

## 2023-06-23 ENCOUNTER — Ambulatory Visit: Admitting: Physician Assistant

## 2023-07-01 ENCOUNTER — Ambulatory Visit: Admitting: Physician Assistant

## 2023-07-12 ENCOUNTER — Ambulatory Visit: Admitting: Physician Assistant

## 2023-09-06 ENCOUNTER — Ambulatory Visit: Payer: Medicare PPO | Admitting: Physician Assistant

## 2023-09-24 DIAGNOSIS — Z23 Encounter for immunization: Secondary | ICD-10-CM | POA: Diagnosis not present

## 2023-10-12 ENCOUNTER — Ambulatory Visit: Payer: Self-pay

## 2023-10-12 ENCOUNTER — Institutional Professional Consult (permissible substitution): Payer: Medicare PPO | Admitting: Psychology

## 2023-10-20 ENCOUNTER — Encounter: Payer: Medicare PPO | Admitting: Psychology

## 2023-10-28 DIAGNOSIS — Z8546 Personal history of malignant neoplasm of prostate: Secondary | ICD-10-CM | POA: Diagnosis not present

## 2023-10-28 DIAGNOSIS — Z1331 Encounter for screening for depression: Secondary | ICD-10-CM | POA: Diagnosis not present

## 2023-10-28 DIAGNOSIS — Z0000 Encounter for general adult medical examination without abnormal findings: Secondary | ICD-10-CM | POA: Diagnosis not present

## 2023-10-28 DIAGNOSIS — E559 Vitamin D deficiency, unspecified: Secondary | ICD-10-CM | POA: Diagnosis not present

## 2023-10-28 DIAGNOSIS — M109 Gout, unspecified: Secondary | ICD-10-CM | POA: Diagnosis not present

## 2023-10-28 DIAGNOSIS — E782 Mixed hyperlipidemia: Secondary | ICD-10-CM | POA: Diagnosis not present

## 2023-10-28 DIAGNOSIS — R7303 Prediabetes: Secondary | ICD-10-CM | POA: Diagnosis not present

## 2023-10-28 DIAGNOSIS — Z125 Encounter for screening for malignant neoplasm of prostate: Secondary | ICD-10-CM | POA: Diagnosis not present

## 2023-10-28 DIAGNOSIS — I10 Essential (primary) hypertension: Secondary | ICD-10-CM | POA: Diagnosis not present

## 2023-10-28 DIAGNOSIS — E538 Deficiency of other specified B group vitamins: Secondary | ICD-10-CM | POA: Diagnosis not present

## 2023-11-02 NOTE — Progress Notes (Signed)
 Assessment/Plan:    Mild cognitive impairment*** Memory impairment***  [name] is a very pleasant 68 y.o. 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  He presenting today in follow-up for evaluation of memory loss. Patient is on donepezil  10 mg daily, tolerating well.  Memory is stable***, MMSE today is******.  Mood is     Recommendations:   Follow up in   months. Continue donepezil  10 mg daily, side effects discussed Follow-up sleep apnea, CPAP issues with specialist Recommend good control of cardiovascular risk factors Continue to control mood as per PCP    Subjective:   This patient is accompanied in the office by his wife***  who supplements the history. Previous records as well as any outside records available were reviewed prior to todays visit.   Patient was last seen on 05/13/2023.***.    Any changes in memory since last visit? . repeats oneself?  Endorsed Disoriented when walking into a room?  Patient denies ***  Misplacing objects?  Patient denies   Wandering behavior?   Denies. Any personality changes since last visit? Denies.   Any worsening depression?: denies.   Hallucinations or paranoia?  Denies.   Seizures?   Denies.    Any sleep changes? Sleeps well***. Does not sleep very well***.   Denies vivid dreams, REM behavior or sleepwalking   Sleep apnea?   denies ***  Any hygiene concerns?   Denies.   Independent of bathing and dressing?  Endorsed  Does the patient needs help with medications? Patient is in charge *** Who is in charge of the finances?  Patient is in charge   *** Any changes in appetite?  denies ***   Patient have trouble swallowing?  Denies.   Does the patient cook?  Any kitchen accidents such as leaving the stove on?   Denies.   Any headaches?    Denies.   Vision changes? Denies. Chronic pain?  Denies.   Ambulates with difficulty?     Denies. ***  Recent falls or head injuries?    Denies.      Unilateral weakness, numbness or tingling?  Denies.   Any tremors?  Denies.   Any anosmia?    Denies.   Any incontinence of urine?  Denies.   Any bowel dysfunction?  Denies.      Patient lives .*** Does the patient drive?***    Neuropsych evaluation 02/08/23 Briefly, results suggested diffuse cognitive dysfunction. Severe impairment was exhibited across all aspects of learning and memory. Additional impairments were exhibited across processing speed, executive functioning, and semantic fluency. Some variability was further exhibited across visuospatial tasks; however, all but one of these tasks was normatively appropriate. The etiology for ongoing cognitive impairment is unclear. Notably, diagnostic clarity is significantly hampered by ongoing alcohol abuse/dependence. Notes in his medical record as recent as this past October suggest the consumption of eight beers daily. Currently, he indicated consumption of five beers daily with his significant other separately suggesting that this was likely an underestimation. This degree of alcohol abuse certainly has the potential to dramatically impact memory and other cognitive abilities. There remains the potential that this represents the primary source for ongoing impairment and he is certainly at an elevated risk for an alcohol-related dementia presentation in the future. These difficulties would be further exacerbated by untreated sleep apnea. Neurologically speaking, I cannot rule out the presence of illness  such as Alzheimer's disease. However, as stated above, symptom overlap between Alzheimer's disease and an alcohol-related dementia presentation is quite significant as amnestic memory patterns can be seen in both cases.    Plasma biomarkers: Negative results for Alzheimer's etiology   MRI brain 11/11/22 (read 03/03/2023 by radiology) no acute brain abnormality, mild parenchymal volume loss  without lobar specific atrophy, mild volume loss of the bilateral hyper canthi, mild chronic microvascular ischemic changes    Initial visit October 2024 How long did patient have memory difficulties? For the last 6 to 8 years per wife's report, worse over the last year.  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 She discussed her observations with other wives 4 of them out of 7 noticed that their husbands were showing same cognitive and behavioral changes, but did not seek medical attention. According to her, he becomes distracted, has loss of attention.  At times, he becomes very agitated and seems confused and forgetful, needs repeated instructions. Reports some difficulty remembering new information, conversations and names.  Long-term memory is good. 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 He has a daily routine, but if out of his routine, he asks what are we doing next?[name] He likes to read the paper.  repeats oneself?  Endorsed Disoriented when walking into a room?  Patient denies   Leaving objects in unusual places? Denies.   Wandering behavior?  denies .  Any personality changes?    He is quick to anger and has become impatient I started seeing it in 2017-wife says.  Any history of depression?:  Denies   Hallucinations or paranoia?  Denies   Seizures?  Denies    Any sleep changes? Sleeps well with the CPAP but has not been using it.  He does dream more than usual  Denies REM behavior or sleepwalking   Sleep apnea? Endorsed Any hygiene concerns?  Denies   Independent of bathing and dressing?  Endorsed  Does the patient needs help with medications? Patient is in charge   Who is in charge of the finances? Patient is in charge     Any changes in appetite? Eating well.  Patient have trouble swallowing? Denies.   Does the patient cook? Yes, denies any kitchen accidents.  Any kitchen accidents such as leaving the  stove on? Denies.   Any history of headaches?  Not really Had several TBI from HS to College and semi-pro.   Chronic pain ? Denies.  Mostly arthritic in hips and knees Ambulates with difficulty?  Denies.   Recent falls or head injuries?  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 Vision changes? Needs glasses to read.   Unilateral weakness, numbness or tingling? Denies.   Any tremors?   Denies.   Any anosmia?  Denies.   Any incontinence of urine? Endorsed, urge incontinence.gets up 3-4 times to urinate.  Any bowel dysfunction? Denies.      Patient lives with wife  History of heavy alcohol intake?  Endorsed, 8 12 oz beers a day.He has done so for many decades, he considers himself a functional alcoholic.  History of heavy tobacco use?  Never smoked Family history of dementia? Denies.  Does patient drive? Yes, he got lost on 2 occasions.  Retired Museum/gallery curator, Runner, broadcasting/film/video, Charity fundraiser. Retired in 2013     Past Medical History:  Diagnosis Date   Alcohol use disorder  Arthritis    Fatty liver    History of multiple concussions    football related   Hypercholesteremia    Hypertension    Mild cognitive impairment with memory loss 02/08/2023   Obstructive sleep apnea    CPAP noncompliance   Prediabetes    Prostate cancer    Vitamin B12 deficiency      Past Surgical History:  Procedure Laterality Date   ACHILLES TENDON REPAIR  2009   KNEE ARTHROTOMY  1976   LEFT   PROSTATE BIOPSY     PROSTATECTOMY  2012   STERIOD INJECTION  08/20/2011   Procedure: STEROID INJECTION;  Surgeon: Lonni CINDERELLA Poli, MD;  Location: [facility];  Service: Orthopedics;  Laterality: Left;  Left Hip Steroid Injection   TOTAL HIP ARTHROPLASTY  08/20/2011   Procedure: TOTAL HIP ARTHROPLASTY ANTERIOR APPROACH;  Surgeon: Lonni CINDERELLA Poli, MD;  Location: [facility];  Service: Orthopedics;  Laterality: Right;  Right Total Hip Replacement, Anterior Approach   TOTAL  HIP ARTHROPLASTY  12/17/2011   Procedure: TOTAL HIP ARTHROPLASTY ANTERIOR APPROACH;  Surgeon: Lonni CINDERELLA Poli, MD;  Location: [facility];  Service: Orthopedics;  Laterality: Left;  Left Total Hip Arthroplasty, Anterior Approach     PREVIOUS MEDICATIONS:   CURRENT MEDICATIONS:  Outpatient Encounter Medications as of 11/03/2023  Medication Sig   aspirin  EC 325 MG EC tablet Take 1 tablet (325 mg total) by mouth 2 (two) times daily.   Cholecalciferol  (VITAMIN D ) 2000 UNITS CAPS Take 1 capsule by mouth daily.    Cyanocobalamin  (VITAMIN B-12 IJ) Inject 1,000 mg as directed every 30 (thirty) days.   donepezil  (ARICEPT ) 10 MG tablet Take  one tablet  daily.   fish oil-omega-3 fatty acids 1000 MG capsule Take 1 g by mouth daily.   glucosamine-chondroitin 500-400 MG tablet Take 1 tablet by mouth 2 (two) times daily.   Multiple Vitamins-Minerals (MULTIVITAMINS THER. W/MINERALS) TABS Take 1 tablet by mouth daily.    Probiotic Product (PHILLIPS COLON HEALTH PO) Take 1 capsule by mouth daily as needed. constipation   rosuvastatin (CRESTOR) 10 MG tablet    valsartan -hydrochlorothiazide  (DIOVAN -HCT) 160-25 MG per tablet Take 1 tablet by mouth daily with breakfast.    No facility-administered encounter medications on file as of 11/03/2023.     Objective:     PHYSICAL EXAMINATION:    VITALS:  There were no vitals filed for this visit.  GEN:  The patient appears stated age and is in NAD. HEENT:  Normocephalic, atraumatic.   Neurological examination:  General: NAD, well-groomed, appears stated age. Orientation: The patient is alert. Oriented to person, place and not to date.*** Cranial nerves: There is good facial symmetry.The speech is fluent and clear. No aphasia or dysarthria. Fund of knowledge is appropriate. Recent memory impaired and remote memory is normal.  Attention and concentration are normal.  Able to name objects and repeat phrases.  Hearing is intact to conversational tone ***.    Delayed recall *** Sensation: Sensation is intact to light touch throughout Motor: Strength is at least antigravity x4. DTR's 2/4 in UE/LE      11/11/2022    1:00 PM  Montreal Cognitive Assessment   Visuospatial/ Executive (0/5) 3  Naming (0/3) 3  Attention: Read list of digits (0/2) 2  Attention: Read list of letters (0/1) 1  Attention: Serial 7 subtraction starting at 100 (0/3) 1  Language: Repeat phrase (0/2) 2  Language : Fluency (0/1) 1  Abstraction (0/2) 2  Delayed Recall (0/5) 0  Orientation (0/6) 6  Total 21  Adjusted Score (based on education) 21        No data to display             Movement examination: Tone: There is normal tone in the UE/LE Abnormal movements:  no tremor.  No myoclonus.  No asterixis.   Coordination:  There is no decremation with RAM's. Normal finger to nose  Gait and Station: The patient has no difficulty arising out of a deep-seated chair without the use of the hands. The patient's stride length is good.  Gait is cautious and narrow.   Thank you for allowing us  the opportunity to participate in the care of this nice patient. Please do not hesitate to contact us  for any questions or concerns.   Total time spent on today's visit was *** minutes dedicated to this patient today, preparing to see patient, examining the patient, ordering tests and/or medications and counseling the patient, documenting clinical information in the EHR or other health record, independently interpreting results and communicating results to the patient/family, discussing treatment and goals, answering patient's questions and coordinating care.  Cc:  Auston Opal, DO  [facility] 11/02/2023 6:07 AM

## 2023-11-03 ENCOUNTER — Encounter: Payer: Self-pay | Admitting: Physician Assistant

## 2023-11-03 ENCOUNTER — Ambulatory Visit: Admitting: Physician Assistant

## 2023-11-03 VITALS — BP 136/88 | HR 74 | Resp 18 | Ht 72.0 in

## 2023-11-03 DIAGNOSIS — R413 Other amnesia: Secondary | ICD-10-CM | POA: Diagnosis not present

## 2023-11-03 DIAGNOSIS — Z87820 Personal history of traumatic brain injury: Secondary | ICD-10-CM

## 2023-11-03 DIAGNOSIS — R4586 Emotional lability: Secondary | ICD-10-CM

## 2023-11-03 DIAGNOSIS — G3184 Mild cognitive impairment, so stated: Secondary | ICD-10-CM

## 2023-11-03 NOTE — Patient Instructions (Addendum)
 It was a pleasure to see you today at our office.   Recommendations:   Follow up May 6 at 11:30  stop Donepezil   10 mg daily.    Follow up sleep apnea, CPAP issues  Referral to psychiatry for mood control     [email or web address]   RECOMMENDATIONS FOR ALL PATIENTS WITH MEMORY PROBLEMS: 1. Continue to exercise (Recommend 30 minutes of walking everyday, or 3 hours every week) 2. Increase social interactions - continue going to [place] and enjoy social gatherings with friends and family 3. Eat healthy, avoid fried foods and eat more fruits and vegetables 4. Maintain adequate blood pressure, blood sugar, and blood cholesterol level. Reducing the risk of stroke and cardiovascular disease also helps promoting better memory. 5. Avoid stressful situations. Live a simple life and avoid aggravations. Organize your time and prepare for the next day in anticipation. 6. Sleep well, avoid any interruptions of sleep and avoid any distractions in the bedroom that may interfere with adequate sleep quality 7. Avoid sugar, avoid sweets as there is a strong link between excessive sugar intake, diabetes, and cognitive impairment We discussed the Mediterranean diet, which has been shown to help patients reduce the risk of progressive memory disorders and reduces cardiovascular risk. This includes eating fish, eat fruits and green leafy vegetables, nuts like almonds and hazelnuts, walnuts, and also use olive oil. Avoid fast foods and fried foods as much as possible. Avoid sweets and sugar as sugar use has been linked to worsening of memory function.  There is always a concern of gradual progression of memory problems. If this is the case, then we may need to adjust level of care according to patient needs. Support, both to the patient and caregiver, should then be put into place.        DRIVING: Regarding driving, in patients with progressive memory  problems, driving will be impaired. We advise to have someone else do the driving if trouble finding directions or if minor accidents are reported. Independent driving assessment is available to determine safety of driving.   If you are interested in the driving assessment, you can contact the following:  [facility] in [place] [phone]  [facility] [phone]  [facility] [phone]  [facility] [phone] or [phone]   FALL PRECAUTIONS: Be cautious when walking. Scan the area for obstacles that may increase the risk of trips and falls. When getting up in the mornings, sit up at the edge of the bed for a few minutes before getting out of bed. Consider elevating the bed at the head end to avoid drop of blood pressure when getting up. Walk always in a well-lit room (use night lights in the walls). Avoid area rugs or power cords from appliances in the middle of the walkways. Use a walker or a cane if necessary and consider physical therapy for balance exercise. Get your eyesight checked regularly.  FINANCIAL OVERSIGHT: Supervision, especially oversight when making financial decisions or transactions is also recommended.  HOME SAFETY: Consider the safety of the kitchen when operating appliances like stoves, microwave oven, and blender. Consider having supervision and share cooking responsibilities until no longer able to participate in those. Accidents with firearms and other hazards in the house should be identified and addressed as well.   ABILITY TO BE LEFT ALONE: If patient is unable to contact 911 operator, consider using LifeLine, or when the need is there, arrange for someone to stay with patients. Smoking is a Air cabin crew  hazard, consider supervision or cessation. Risk of wandering should be assessed by caregiver and if detected at any point, supervision and safe proof recommendations should be instituted.  MEDICATION SUPERVISION:  Inability to self-administer medication needs to be constantly addressed. Implement a mechanism to ensure safe administration of the medications.      Mediterranean Diet A Mediterranean diet refers to food and lifestyle choices that are based on the traditions of countries located on the [place]. This way of eating has been shown to help prevent certain conditions and improve outcomes for people who have chronic diseases, like kidney disease and heart disease. What are tips for following this plan? Lifestyle  Cook and eat meals together with your family, when possible. Drink enough fluid to keep your urine clear or pale yellow. Be physically active every day. This includes: Aerobic exercise like running or swimming. Leisure activities like gardening, walking, or housework. Get 7-8 hours of sleep each night. If recommended by your health care provider, drink red wine in moderation. This means 1 glass a day for nonpregnant women and 2 glasses a day for men. A glass of wine equals 5 oz (150 mL). Reading food labels  Check the serving size of packaged foods. For foods such as rice and pasta, the serving size refers to the amount of cooked product, not dry. Check the total fat in packaged foods. Avoid foods that have saturated fat or trans fats. Check the ingredients list for added sugars, such as corn syrup. Shopping  At the grocery store, buy most of your food from the areas near the walls of the store. This includes: Fresh fruits and vegetables (produce). Grains, beans, nuts, and seeds. Some of these may be available in unpackaged forms or large amounts (in bulk). Fresh seafood. Poultry and eggs. Low-fat dairy products. Buy whole ingredients instead of prepackaged foods. Buy fresh fruits and vegetables in-season from local farmers markets. Buy frozen fruits and vegetables in resealable bags. If you do not have access to quality fresh seafood, buy precooked frozen shrimp or  canned fish, such as tuna, salmon, or sardines. Buy small amounts of raw or cooked vegetables, salads, or olives from the deli or salad bar at your store. Stock your pantry so you always have certain foods on hand, such as olive oil, canned tuna, canned tomatoes, rice, pasta, and beans. Cooking  Cook foods with extra-virgin olive oil instead of using butter or other vegetable oils. Have meat as a side dish, and have vegetables or grains as your main dish. This means having meat in small portions or adding small amounts of meat to foods like pasta or stew. Use beans or vegetables instead of meat in common dishes like chili or lasagna. Experiment with different cooking methods. Try roasting or broiling vegetables instead of steaming or sauteing them. Add frozen vegetables to soups, stews, pasta, or rice. Add nuts or seeds for added healthy fat at each meal. You can add these to yogurt, salads, or vegetable dishes. Marinate fish or vegetables using olive oil, lemon juice, garlic, and fresh herbs. Meal planning  Plan to eat 1 vegetarian meal one day each week. Try to work up to 2 vegetarian meals, if possible. Eat seafood 2 or more times a week. Have healthy snacks readily available, such as: Vegetable sticks with hummus. Greek yogurt. Fruit and nut trail mix. Eat balanced meals throughout the week. This includes: Fruit: 2-3 servings a day Vegetables: 4-5 servings a day Low-fat dairy: 2 servings a day Fish,  poultry, or lean meat: 1 serving a day Beans and legumes: 2 or more servings a week Nuts and seeds: 1-2 servings a day Whole grains: 6-8 servings a day Extra-virgin olive oil: 3-4 servings a day Limit red meat and sweets to only a few servings a month What are my food choices? Mediterranean diet Recommended Grains: Whole-grain pasta. Brown rice. Bulgar wheat. Polenta. Couscous. Whole-wheat bread. [place] Madeira. Vegetables: Artichokes. Beets. Broccoli. Cabbage. Carrots. Eggplant.  Green beans. Chard. Kale. Spinach. Onions. Leeks. Peas. Squash. Tomatoes. Peppers. Radishes. Fruits: Apples. Apricots. Avocado. Berries. Bananas. Cherries. Dates. Figs. Grapes. Lemons. Melon. Oranges. Peaches. Plums. Pomegranate. Meats and other protein foods: Beans. Almonds. Sunflower seeds. Pine nuts. Peanuts. Cod. Salmon. Scallops. Shrimp. Tuna. Tilapia. Clams. Oysters. Eggs. Dairy: Low-fat milk. Cheese. Greek yogurt. Beverages: Water. Red wine. Herbal tea. Fats and oils: Extra virgin olive oil. Avocado oil. Grape seed oil. Sweets and desserts: [place] yogurt with honey. Baked apples. Poached pears. Trail mix. Seasoning and other foods: Basil. Cilantro. Coriander. Cumin. Mint. Parsley. Sage. Rosemary. Tarragon. Garlic. Oregano. Thyme. Pepper. Balsalmic vinegar. Tahini. Hummus. Tomato sauce. Olives. Mushrooms. Limit these Grains: Prepackaged pasta or rice dishes. Prepackaged cereal with added sugar. Vegetables: Deep fried potatoes (french fries). Fruits: Fruit canned in syrup. Meats and other protein foods: Beef. Pork. Lamb. Poultry with skin. Hot dogs. [place]. Dairy: Ice cream. Sour cream. Whole milk. Beverages: Juice. Sugar-sweetened soft drinks. Beer. Liquor and spirits. Fats and oils: Butter. Canola oil. Vegetable oil. Beef fat (tallow). Lard. Sweets and desserts: Cookies. Cakes. Pies. Candy. Seasoning and other foods: Mayonnaise. Premade sauces and marinades. The items listed may not be a complete list. Talk with your dietitian about what dietary choices are right for you. Summary The Mediterranean diet includes both food and lifestyle choices. Eat a variety of fresh fruits and vegetables, beans, nuts, seeds, and whole grains. Limit the amount of red meat and sweets that you eat. Talk with your health care provider about whether it is safe for you to drink red wine in moderation. This means 1 glass a day for nonpregnant women and 2 glasses a day for men. A glass of wine equals 5 oz (150  mL). This information is not intended to replace advice given to you by your health care provider. Make sure you discuss any questions you have with your health care provider. Document Released: 08/21/2015 Document Revised: 09/23/2015 Document Reviewed: 08/21/2015 Elsevier Interactive Patient Education  2017 [employer].    [address]

## 2023-12-21 DIAGNOSIS — F1091 Alcohol use, unspecified, in remission: Secondary | ICD-10-CM | POA: Diagnosis not present

## 2023-12-21 DIAGNOSIS — F320 Major depressive disorder, single episode, mild: Secondary | ICD-10-CM | POA: Diagnosis not present

## 2024-01-02 ENCOUNTER — Encounter: Payer: Self-pay | Admitting: Physician Assistant

## 2024-01-02 ENCOUNTER — Telehealth: Payer: Self-pay | Admitting: Physician Assistant

## 2024-01-02 NOTE — Telephone Encounter (Signed)
 Pt's wife is asking for a A letter to [employer] from [employer] due to diagnosis

## 2024-01-03 NOTE — Telephone Encounter (Signed)
Patient to pick up letter today

## 2024-05-16 ENCOUNTER — Ambulatory Visit: Admitting: Physician Assistant
